# Patient Record
Sex: Male | Born: 1970 | Race: White | Hispanic: No | Smoking: Current every day smoker
Health system: Southern US, Community
[De-identification: ages and names within clinical notes are randomized; demographics above are authoritative.]

## PROBLEM LIST (undated history)

## (undated) DIAGNOSIS — J449 Chronic obstructive pulmonary disease, unspecified: Secondary | ICD-10-CM

## (undated) DIAGNOSIS — I10 Essential (primary) hypertension: Secondary | ICD-10-CM

## (undated) DIAGNOSIS — R45851 Suicidal ideations: Secondary | ICD-10-CM

## (undated) DIAGNOSIS — F1010 Alcohol abuse, uncomplicated: Secondary | ICD-10-CM

## (undated) HISTORY — PX: OTHER SURGICAL HISTORY: SHX169

---

## 2004-05-05 ENCOUNTER — Emergency Department: Payer: Self-pay | Admitting: Emergency Medicine

## 2004-05-06 ENCOUNTER — Emergency Department: Payer: Self-pay | Admitting: Emergency Medicine

## 2004-05-07 ENCOUNTER — Ambulatory Visit: Payer: Self-pay

## 2008-09-10 ENCOUNTER — Emergency Department: Payer: Self-pay | Admitting: Internal Medicine

## 2012-05-12 DIAGNOSIS — M79603 Pain in arm, unspecified: Secondary | ICD-10-CM | POA: Insufficient documentation

## 2012-05-12 DIAGNOSIS — T148XXA Other injury of unspecified body region, initial encounter: Secondary | ICD-10-CM | POA: Insufficient documentation

## 2013-06-17 ENCOUNTER — Emergency Department: Payer: Self-pay | Admitting: Emergency Medicine

## 2015-04-23 DIAGNOSIS — G894 Chronic pain syndrome: Secondary | ICD-10-CM | POA: Insufficient documentation

## 2015-09-20 DIAGNOSIS — F410 Panic disorder [episodic paroxysmal anxiety] without agoraphobia: Secondary | ICD-10-CM | POA: Insufficient documentation

## 2015-11-21 DIAGNOSIS — Z5181 Encounter for therapeutic drug level monitoring: Secondary | ICD-10-CM | POA: Insufficient documentation

## 2015-11-21 DIAGNOSIS — R5382 Chronic fatigue, unspecified: Secondary | ICD-10-CM | POA: Insufficient documentation

## 2015-11-21 DIAGNOSIS — F330 Major depressive disorder, recurrent, mild: Secondary | ICD-10-CM | POA: Insufficient documentation

## 2015-11-21 DIAGNOSIS — F1190 Opioid use, unspecified, uncomplicated: Secondary | ICD-10-CM | POA: Insufficient documentation

## 2016-01-21 DIAGNOSIS — Z79899 Other long term (current) drug therapy: Secondary | ICD-10-CM | POA: Insufficient documentation

## 2016-10-05 ENCOUNTER — Emergency Department
Admission: EM | Admit: 2016-10-05 | Discharge: 2016-10-05 | Disposition: A | Discharge status: Home / Self Care | Payer: Medicaid Other | Attending: Emergency Medicine | Admitting: Emergency Medicine

## 2016-10-05 ENCOUNTER — Emergency Department: Payer: Medicaid Other

## 2016-10-05 DIAGNOSIS — N23 Unspecified renal colic: Secondary | ICD-10-CM | POA: Insufficient documentation

## 2016-10-05 DIAGNOSIS — N50819 Testicular pain, unspecified: Secondary | ICD-10-CM

## 2016-10-05 DIAGNOSIS — R300 Dysuria: Secondary | ICD-10-CM | POA: Diagnosis not present

## 2016-10-05 DIAGNOSIS — R1031 Right lower quadrant pain: Secondary | ICD-10-CM | POA: Diagnosis present

## 2016-10-05 LAB — URINALYSIS, COMPLETE (UACMP) WITH MICROSCOPIC
Bacteria, UA: NONE SEEN
Bilirubin Urine: NEGATIVE
GLUCOSE, UA: NEGATIVE mg/dL
KETONES UR: NEGATIVE mg/dL
LEUKOCYTES UA: NEGATIVE
Nitrite: NEGATIVE
PROTEIN: NEGATIVE mg/dL
RBC / HPF: NONE SEEN RBC/hpf (ref 0–5)
Specific Gravity, Urine: 1.001 — ABNORMAL LOW (ref 1.005–1.030)
pH: 6 (ref 5.0–8.0)

## 2016-10-05 MED ORDER — TAMSULOSIN HCL 0.4 MG PO CAPS: 0.4000 mg | ORAL_CAPSULE | Freq: Every morning | ORAL | 0 refills | Status: DC

## 2016-10-05 MED ORDER — KETOROLAC TROMETHAMINE 60 MG/2ML IM SOLN
INTRAMUSCULAR | Status: AC
  Administered 2016-10-05: 60 mg via INTRAMUSCULAR
  Filled 2016-10-05: qty 2

## 2016-10-05 MED ORDER — OXYCODONE-ACETAMINOPHEN 5-325 MG PO TABS: 1.0000 | ORAL_TABLET | Freq: Four times a day (QID) | ORAL | 0 refills | Status: DC | PRN

## 2016-10-05 MED ORDER — OXYCODONE-ACETAMINOPHEN 5-325 MG PO TABS
2.0000 | ORAL_TABLET | Freq: Once | ORAL | Status: AC
  Administered 2016-10-05: 2 via ORAL
  Filled 2016-10-05: qty 2

## 2016-10-05 MED ORDER — KETOROLAC TROMETHAMINE 60 MG/2ML IM SOLN
60.0000 mg | Freq: Once | INTRAMUSCULAR | Status: AC
  Administered 2016-10-05: 60 mg via INTRAMUSCULAR

## 2016-10-05 NOTE — ED Provider Notes (Signed)
[facility] Emergency Department Provider Note       Time seen: ----------------------------------------- 9:46 PM on 10/05/2016 -----------------------------------------     I have reviewed the triage vital signs and the nursing notes.   HISTORY   Chief Complaint Groin Pain (right side) and Dysuria    HPI Jason Faulkner is a 46 y.o. male who presents to the ED for right-sided groin and testicular pain with pressure upon urination for 2 days. Patient describes pain in the suprapubic area, pain is 4 out of 10, aching and he also has pain in his right testicle. He has never had this happen before. He denies any heavy lifting or other complaints.   No past medical history on file.  There are no active problems to display for this patient.   No past surgical history on file.  Allergies Patient has no known allergies.  Social History Social History  Substance Use Topics  . Smoking status: Not on file  . Smokeless tobacco: Not on file  . Alcohol use Not on file    Review of Systems Constitutional: Negative for fever. Cardiovascular: Negative for chest pain. Respiratory: Negative for shortness of breath. Gastrointestinal: Negative for abdominal pain, vomiting and diarrhea. Genitourinary: Positive for dysuria and testicular pain Musculoskeletal: Negative for back pain. Skin: Negative for rash. Neurological: Negative for headaches, focal weakness or numbness.  All systems negative/normal/unremarkable except as stated in the HPI  ____________________________________________   PHYSICAL EXAM:  VITAL SIGNS: ED Triage Vitals  Enc Vitals Group     BP 10/05/16 2112 140/81     Pulse Rate 10/05/16 2112 81     Resp 10/05/16 2112 19     Temp 10/05/16 2112 98.4 F (36.9 C)     Temp Source 10/05/16 2112 Oral     SpO2 10/05/16 2112 99 %     Weight 10/05/16 2114 135 lb (61.2 kg)     Height 10/05/16 2114 5\' 4"  (1.626 m)     Head Circumference --       Peak Flow --      Pain Score 10/05/16 2115 4     Pain Loc --      Pain Edu? --      Excl. in GC? --     Constitutional: Alert and oriented. Well appearing and in no distress. Eyes: Conjunctivae are normal. Normal extraocular movements. Gastrointestinal: Soft and nontender. Normal bowel sounds Genitourinary: Grossly unremarkable scrotal examination. Circumcised male, nonfocal tenderness in the testicles, perhaps worse on the left. Suprapubic tenderness. Musculoskeletal: Nontender with normal range of motion in extremities. No lower extremity tenderness nor edema. Neurologic:  Normal speech and language. No gross focal neurologic deficits are appreciated.  Skin:  Skin is warm, dry and intact. No rash noted. Psychiatric: Mood and affect are normal. Speech and behavior are normal.  ___________________________________________  ED COURSE:  Pertinent labs & imaging results that were available during my care of the patient were reviewed by me and considered in my medical decision making (see chart for details). Patient presents for scrotal pain, we will assess with labs and imaging as indicated.   Procedures ____________________________________________   LABS (pertinent positives/negatives)  Labs Reviewed  URINALYSIS, COMPLETE (UACMP) WITH MICROSCOPIC - Abnormal; Notable for the following:       Result Value   Color, Urine COLORLESS (*)    APPearance CLEAR (*)    Specific Gravity, Urine 1.001 (*)    Hgb urine dipstick MODERATE (*)    Squamous Epithelial / LPF  0-5 (*)    All other components within normal limits    RADIOLOGY Images were viewed by me Scrotal ultrasound is normal Renal ultrasound IMPRESSION: 1. A 3 mm right UVJ stone with mild right hydronephrosis. 2. Cholelithiasis. Ultrasound may provide better evaluation of the gallbladder if clinically indicated. 3. No bowel obstruction or active inflammation. Normal appendix. 4. Aortic Atherosclerosis  (ICD10-I70.0). ____________________________________________  FINAL ASSESSMENT AND PLAN  Renal colic  Plan: Patient's labs and imaging were dictated above. Patient had presented for groin pain and has evidence of renal colic. He'll be discharged with pain medicine, Flomax and outpatient urology follow-up.   Emily FilbertWilliams, Adessa Primiano E, MD   Note: This note was generated in part or whole with voice recognition software. Voice recognition is usually quite accurate but there are transcription errors that can and very often do occur. I apologize for any typographical errors that were not detected and corrected.     Emily FilbertWilliams, Denecia Brunette E, MD 10/05/16 [phone]

## 2016-10-05 NOTE — ED Notes (Signed)
ED Provider at bedside. 

## 2016-10-05 NOTE — ED Triage Notes (Signed)
Pt presents to ED c/o right groin/testicle pain with pressure upon urination x2 days

## 2016-12-08 ENCOUNTER — Encounter: Payer: Self-pay | Admitting: Emergency Medicine

## 2016-12-08 ENCOUNTER — Emergency Department
Admission: EM | Admit: 2016-12-08 | Discharge: 2016-12-08 | Disposition: A | Discharge status: Home / Self Care | Payer: Medicaid Other | Attending: Emergency Medicine | Admitting: Emergency Medicine

## 2016-12-08 DIAGNOSIS — X58XXXA Exposure to other specified factors, initial encounter: Secondary | ICD-10-CM | POA: Insufficient documentation

## 2016-12-08 DIAGNOSIS — Z79899 Other long term (current) drug therapy: Secondary | ICD-10-CM | POA: Insufficient documentation

## 2016-12-08 DIAGNOSIS — S46812A Strain of other muscles, fascia and tendons at shoulder and upper arm level, left arm, initial encounter: Secondary | ICD-10-CM | POA: Insufficient documentation

## 2016-12-08 DIAGNOSIS — Y929 Unspecified place or not applicable: Secondary | ICD-10-CM | POA: Diagnosis not present

## 2016-12-08 DIAGNOSIS — Y999 Unspecified external cause status: Secondary | ICD-10-CM | POA: Insufficient documentation

## 2016-12-08 DIAGNOSIS — S4992XA Unspecified injury of left shoulder and upper arm, initial encounter: Secondary | ICD-10-CM | POA: Diagnosis present

## 2016-12-08 DIAGNOSIS — S46912A Strain of unspecified muscle, fascia and tendon at shoulder and upper arm level, left arm, initial encounter: Secondary | ICD-10-CM

## 2016-12-08 DIAGNOSIS — F17200 Nicotine dependence, unspecified, uncomplicated: Secondary | ICD-10-CM | POA: Insufficient documentation

## 2016-12-08 DIAGNOSIS — Y939 Activity, unspecified: Secondary | ICD-10-CM | POA: Diagnosis not present

## 2016-12-08 MED ORDER — LIDOCAINE 5 % EX PTCH
1.0000 | MEDICATED_PATCH | Freq: Once | CUTANEOUS | Status: DC
  Administered 2016-12-08: 1 via TRANSDERMAL
  Filled 2016-12-08: qty 1

## 2016-12-08 MED ORDER — ORPHENADRINE CITRATE 30 MG/ML IJ SOLN
60.0000 mg | Freq: Two times a day (BID) | INTRAMUSCULAR | Status: DC
  Administered 2016-12-08: 60 mg via INTRAMUSCULAR
  Filled 2016-12-08: qty 2

## 2016-12-08 MED ORDER — METHOCARBAMOL 750 MG PO TABS: 1500.0000 mg | ORAL_TABLET | Freq: Four times a day (QID) | ORAL | 0 refills | Status: DC

## 2016-12-08 MED ORDER — KETOROLAC TROMETHAMINE 60 MG/2ML IM SOLN
60.0000 mg | Freq: Once | INTRAMUSCULAR | Status: AC
  Administered 2016-12-08: 60 mg via INTRAMUSCULAR
  Filled 2016-12-08: qty 2

## 2016-12-08 NOTE — ED Triage Notes (Signed)
Pt c/o left shoulder and upper back pain.  Worse when moves.  Unable to lift left arm all the way. 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  Has been moving MIL things out since she passed away.  Reports he doesn't know if they had mold and could cause this.

## 2016-12-08 NOTE — ED Provider Notes (Signed)
[facility] Emergency Department Provider Note   ____________________________________________   First MD Initiated Contact with Patient 12/08/16 [phone]     (approximate)  I have reviewed the triage vital signs and the nursing notes.   HISTORY  Chief Complaint Shoulder Pain    HPI Jason Faulkner is a 46 y.o. male patient complaining of left upper back pain for 2-3 days. Patient stated pain increases with movement of the left upper extremity.Patient rates pain as 8/10. Patient described a pain as "achy". No palliative measures for complaint. 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 Patient stated he just continue following up with pain management secondary to the side effects of the medication prescribed for him. PCP is now located [place] [facility]. Patient has not contacted him with this complaint.   History reviewed. No pertinent past medical history.  There are no active problems to display for this patient.   Past Surgical History:  Procedure Laterality Date  . arm surgery      Prior to Admission medications   Medication Sig Start Date End Date Taking? Authorizing Provider  methocarbamol (ROBAXIN-750) 750 MG tablet Take 2 tablets (1,500 mg total) by mouth 4 (four) times daily. 12/08/16   Joni Reining, PA-C  oxyCODONE-acetaminophen (PERCOCET) 5-325 MG tablet Take 1-2 tablets by mouth every 6 (six) hours as needed. 10/05/16   Emily Filbert, MD  tamsulosin (FLOMAX) 0.4 MG CAPS capsule Take 1 capsule (0.4 mg total) by mouth every morning. 10/05/16   Emily Filbert, MD    Allergies Patient has no known allergies.  History reviewed. No pertinent family history.  Social History Social History  Substance Use Topics  . Smoking status: Current Every Day Smoker  . Smokeless tobacco: Never Used  . Alcohol use Yes    Review of Systems  Constitutional: No fever/chills Eyes: No visual changes. ENT: No  sore throat. Cardiovascular: Denies chest pain. Respiratory: Denies shortness of breath. Gastrointestinal: No abdominal pain.  No nausea, no vomiting.  No diarrhea.  No constipation. Genitourinary: Negative for dysuria. Musculoskeletal: Left upper back pain. Skin: Negative for rash. Neurological: Negative for headaches, focal weakness or numbness. Psychiatric: Anxiety and depression    ____________________________________________   PHYSICAL EXAM:  VITAL SIGNS: ED Triage Vitals  Enc Vitals Group     BP 12/08/16 0835 (!) 153/96     Pulse Rate 12/08/16 0835 92     Resp 12/08/16 0835 16     Temp 12/08/16 0835 98.2 F (36.8 C)     Temp Source 12/08/16 0835 Oral     SpO2 12/08/16 0835 99 %     Weight 12/08/16 0835 130 lb (59 kg)     Height 12/08/16 0835  (1.626 m)     Head Circumference --      Peak Flow --      Pain Score 12/08/16 0834 8     Pain Loc --      Pain Edu? --      Excl. in GC? --     Constitutional: Alert and oriented. Well appearing and in no acute distress. Neck: No stridor.  No cervical spine tenderness to palpation. Cardiovascular: Normal rate, regular rhythm. Grossly normal heart sounds.  Good peripheral circulation. Respiratory: Normal respiratory effort.  No retractions. Lungs CTAB. Musculoskeletal: Patient has  moderate guarding palpation left superior medial aspect the scapular muscle area Neurologic:  Normal speech and language. No gross focal neurologic deficits are appreciated. No  gait instability. Skin:  Skin is warm, dry and intact. No rash noted. Psychiatric: Mood and affect are normal. Speech and behavior are normal.  ____________________________________________   LABS (all labs ordered are listed, but only abnormal results are displayed)  Labs Reviewed - No data to display ____________________________________________  EKG   ____________________________________________  RADIOLOGY  No results  found.  ____________________________________________   PROCEDURES  Procedure(s) performed: None  Procedures  Critical Care performed: No  ____________________________________________   INITIAL IMPRESSION / ASSESSMENT AND PLAN / ED COURSE  Pertinent labs & imaging results that were available during my care of the patient were reviewed by me and considered in my medical decision making (see chart for details).  Left upper back pain secondary to left scapular muscle strain. Patient given discharge care instructions. Patient advised to take medication as directed and follow-up with PCP for continual care.      ____________________________________________   FINAL CLINICAL IMPRESSION(S) / ED DIAGNOSES  Final diagnoses:  Muscle strain of left scapular region, initial encounter      NEW MEDICATIONS STARTED DURING THIS VISIT:  New Prescriptions   METHOCARBAMOL (ROBAXIN-750) 750 MG TABLET    Take 2 tablets (1,500 mg total) by mouth 4 (four) times daily.     Note:  This document was prepared using Dragon voice recognition software and may include unintentional dictation errors.    Joni Reining, PA-C 12/08/16 1004    Emily Filbert, MD 12/08/16 1012

## 2016-12-08 NOTE — ED Notes (Signed)
Patient reports left shoulder pain for several days.  Patient states, "I was cleaning some boxes and stuff but I don't remember moving anything really heavy."  Patient reports he was unable to sleep last night.  Patient states, "It feels like I got a knife back there."

## 2017-09-30 DIAGNOSIS — R7989 Other specified abnormal findings of blood chemistry: Secondary | ICD-10-CM | POA: Insufficient documentation

## 2018-05-24 ENCOUNTER — Other Ambulatory Visit: Payer: Self-pay

## 2018-05-24 ENCOUNTER — Emergency Department: Payer: Medicaid Other

## 2018-05-24 ENCOUNTER — Encounter: Payer: Self-pay | Admitting: Emergency Medicine

## 2018-05-24 ENCOUNTER — Emergency Department
Admission: EM | Admit: 2018-05-24 | Discharge: 2018-05-24 | Disposition: A | Discharge status: Home / Self Care | Payer: Medicaid Other | Attending: Emergency Medicine | Admitting: Emergency Medicine

## 2018-05-24 DIAGNOSIS — J209 Acute bronchitis, unspecified: Secondary | ICD-10-CM | POA: Diagnosis not present

## 2018-05-24 DIAGNOSIS — F17210 Nicotine dependence, cigarettes, uncomplicated: Secondary | ICD-10-CM | POA: Diagnosis not present

## 2018-05-24 DIAGNOSIS — Z79899 Other long term (current) drug therapy: Secondary | ICD-10-CM | POA: Diagnosis not present

## 2018-05-24 DIAGNOSIS — R0789 Other chest pain: Secondary | ICD-10-CM | POA: Diagnosis present

## 2018-05-24 LAB — CBC
HEMATOCRIT: 53.4 % — AB (ref 39.0–52.0)
HEMOGLOBIN: 18.2 g/dL — AB (ref 13.0–17.0)
MCH: 34.6 pg — ABNORMAL HIGH (ref 26.0–34.0)
MCHC: 34.1 g/dL (ref 30.0–36.0)
MCV: 101.5 fL — ABNORMAL HIGH (ref 80.0–100.0)
Platelets: 221 10*3/uL (ref 150–400)
RBC: 5.26 MIL/uL (ref 4.22–5.81)
RDW: 14.2 % (ref 11.5–15.5)
WBC: 8.4 10*3/uL (ref 4.0–10.5)
nRBC: 0 % (ref 0.0–0.2)

## 2018-05-24 LAB — BASIC METABOLIC PANEL
Anion gap: 10 (ref 5–15)
BUN: 24 mg/dL — AB (ref 6–20)
CHLORIDE: 103 mmol/L (ref 98–111)
CO2: 23 mmol/L (ref 22–32)
Calcium: 9.1 mg/dL (ref 8.9–10.3)
Creatinine, Ser: 0.92 mg/dL (ref 0.61–1.24)
GFR calc Af Amer: >60 mL/min (ref >60)
GFR calc non Af Amer: >60 mL/min (ref >60)
Glucose, Bld: 109 mg/dL — ABNORMAL HIGH (ref 70–99)
POTASSIUM: 4 mmol/L (ref 3.5–5.1)
SODIUM: 136 mmol/L (ref 135–145)

## 2018-05-24 LAB — TROPONIN I: Troponin I: <0.03 ng/mL (ref <0.03)

## 2018-05-24 MED ORDER — IBUPROFEN 600 MG PO TABS: 600.0000 mg | ORAL_TABLET | Freq: Four times a day (QID) | ORAL | 0 refills | Status: DC | PRN

## 2018-05-24 MED ORDER — ALBUTEROL SULFATE HFA 108 (90 BASE) MCG/ACT IN AERS: 2.0000 | INHALATION_SPRAY | RESPIRATORY_TRACT | 0 refills | Status: DC | PRN

## 2018-05-24 NOTE — Discharge Instructions (Signed)
Use the albuterol inhaler every 4-6 hours for shortness of breath or chest tightness.  Take the ibuprofen up to every 6 hours as needed for pain.  Follow-up with your regular doctor.  Return to the ER for new or worsening shortness of breath, fever, or any other new or worsening symptoms that concern you.

## 2018-05-24 NOTE — ED Triage Notes (Signed)
Pt in via POV with right side chest pain, worse with coughing episodes, denies any associated symptoms.  NAD noted at this time.

## 2018-05-24 NOTE — ED Provider Notes (Signed)
[facility] Emergency Department Provider Note ____________________________________________   First MD Initiated Contact with Patient 05/24/18 1114     (approximate)  I have reviewed the triage vital signs and the nursing notes.   HISTORY  Chief Complaint Chest Pain    HPI Jason Faulkner is a 48 y.o. male with no significant PMH who presents with chest pain, gradual onset over the last 3 to 4 days, substernal, described as sharp, occurring when he coughs, and worse with certain positions and movements of the arm.  The patient reports a nonproductive cough over the last 4 days and some shortness of breath.  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  He states he wears a respirator but feels chest tightness when he is at work.  He denies any fever, vomiting or diarrhea, or any other acute symptoms.  History reviewed. No pertinent past medical history.  There are no active problems to display for this patient.   Past Surgical History:  Procedure Laterality Date  . arm surgery      Prior to Admission medications   Medication Sig Start Date End Date Taking? Authorizing Provider  albuterol (PROVENTIL HFA;VENTOLIN HFA) 108 (90 Base) MCG/ACT inhaler Inhale 2 puffs into the lungs every 4 (four) hours as needed for wheezing or shortness of breath. 05/24/18   Dionne Bucy, MD  ibuprofen (ADVIL,MOTRIN) 600 MG tablet Take 1 tablet (600 mg total) by mouth every 6 (six) hours as needed. 05/24/18   Dionne Bucy, MD  methocarbamol (ROBAXIN-750) 750 MG tablet Take 2 tablets (1,500 mg total) by mouth 4 (four) times daily. 12/08/16   Joni Reining, PA-C  oxyCODONE-acetaminophen (PERCOCET) 5-325 MG tablet Take 1-2 tablets by mouth every 6 (six) hours as needed. 10/05/16   Emily Filbert, MD  tamsulosin (FLOMAX) 0.4 MG CAPS capsule Take 1 capsule (0.4 mg total) by mouth every morning. 10/05/16   Emily Filbert, MD    Allergies Patient has no known allergies.  No family history on file.  Social History Social History   Tobacco Use  . Smoking status: Current Every Day Smoker    Packs/day: 1.00    Types: Cigarettes  . Smokeless tobacco: Never Used  Substance Use Topics  . Alcohol use: Yes  . Drug use: No    Review of Systems  Constitutional: No fever. Eyes: No redness. ENT: No sore throat. Cardiovascular: Positive for chest pain. Respiratory: Positive for shortness of breath. Gastrointestinal: No vomiting or diarrhea.  Genitourinary: Negative for dysuria.  Musculoskeletal: Negative for back pain. Skin: Negative for rash. Neurological: Positive for headache.   ____________________________________________   PHYSICAL EXAM:  VITAL SIGNS: ED Triage Vitals  Enc Vitals Group     BP 05/24/18 1015 (!) 143/84     Pulse Rate 05/24/18 1015 87     Resp 05/24/18 1015 16     Temp 05/24/18 1015 98.3 F (36.8 C)     Temp Source 05/24/18 1015 Oral     SpO2 05/24/18 1015 99 %     Weight 05/24/18 1018 125 lb (56.7 kg)     Height 05/24/18 1018 5\' 5"  (1.651 m)     Head Circumference --      Peak Flow --      Pain Score 05/24/18 1017 8     Pain Loc --      Pain Edu? --      Excl. in GC? --  Constitutional: Alert and oriented. Well appearing and in no acute distress. Eyes: Conjunctivae are normal.  Head: Atraumatic. Nose: No congestion/rhinnorhea. Mouth/Throat: Mucous membranes are moist.   Neck: Normal range of motion.  Cardiovascular: Normal rate, regular rhythm. Grossly normal heart sounds.  Good peripheral circulation. Respiratory: Normal respiratory effort.  No retractions. Lungs CTAB. Gastrointestinal: No distention.  Musculoskeletal: Extremities warm and well perfused.  Neurologic:  Normal speech and language. No gross focal neurologic deficits are appreciated.  Skin:  Skin is warm and dry. No rash noted. Psychiatric: Mood and affect are normal. Speech and  behavior are normal.  ____________________________________________   LABS (all labs ordered are listed, but only abnormal results are displayed)  Labs Reviewed  BASIC METABOLIC PANEL - Abnormal; Notable for the following components:      Result Value   Glucose, Bld 109 (*)    BUN 24 (*)    All other components within normal limits  CBC - Abnormal; Notable for the following components:   Hemoglobin 18.2 (*)    HCT 53.4 (*)    MCV 101.5 (*)    MCH 34.6 (*)    All other components within normal limits  TROPONIN I   ____________________________________________  EKG  ED ECG REPORT I, Dionne Bucy, the attending physician, personally viewed and interpreted this ECG.  Date: 05/24/2018 EKG Time: 1012 Rate: 89 Rhythm: normal sinus rhythm QRS Axis: normal Intervals: normal ST/T Wave abnormalities: normal Narrative Interpretation: no evidence of acute ischemia  ____________________________________________  RADIOLOGY  CXR: No focal infiltrate or other acute abnormality  ____________________________________________   PROCEDURES  Procedure(s) performed: No  Procedures  Critical Care performed: No ____________________________________________   INITIAL IMPRESSION / ASSESSMENT AND PLAN / ED COURSE  Pertinent labs & imaging results that were available during my care of the patient were reviewed by me and considered in my medical decision making (see chart for details).  48 year old male with no significant past medical history presents with atypical and positional sharp chest pain associated with cough and some shortness of breath over the last several days as he has been working on a job site with a lot of dust.  He denies fever or GI symptoms.  On exam the patient is well-appearing and his vital signs are normal.  He has no respiratory distress or any significant findings on lung exam.  The remainder of the exam is unremarkable.  EKG is nonischemic.  Chest x-ray  obtained from triage shows no acute abnormalities.  Labs reveal some hemoconcentration but no other acute abnormalities and negative troponin.  Overall presentation is consistent with bronchitis/reactive airways likely from dust, versus viral infection.  There is no evidence of pneumonia.  The patient has no ACS risk factors and given the low risk, reassuring EKG, and the duration of the symptoms there is no indication for repeat troponin.  The patient has no findings to suggest Covid-19 and does not meet testing criteria.  I offered nebulizer treatment and NSAID here, but the patient states he would like to go home with prescriptions.  I will prescribe albuterol and ibuprofen for symptomatic treatment.  I discussed the results of the work-up with the patient.  Return precautions given and he expressed understanding. ____________________________________________   FINAL CLINICAL IMPRESSION(S) / ED DIAGNOSES  Final diagnoses:  Acute bronchitis, unspecified organism      NEW MEDICATIONS STARTED DURING THIS VISIT:  Discharge Medication List as of 05/24/2018 11:34 AM    START taking these medications   Details  albuterol (  PROVENTIL HFA;VENTOLIN HFA) 108 (90 Base) MCG/ACT inhaler Inhale 2 puffs into the lungs every 4 (four) hours as needed for wheezing or shortness of breath., Starting Tue 05/24/2018, Print    ibuprofen (ADVIL,MOTRIN) 600 MG tablet Take 1 tablet (600 mg total) by mouth every 6 (six) hours as needed., Starting Tue 05/24/2018, Print         Note:  This document was prepared using Dragon voice recognition software and may include unintentional dictation errors.    Dionne Bucy, MD 05/24/18 1154

## 2018-05-24 NOTE — ED Notes (Signed)
Report received from [place], [place], care of pt assumed, NAD noted, pt awaiting provider evaluation at this time.  Will monitor.

## 2018-05-24 NOTE — ED Notes (Signed)
Pt taken to xray via stretcher, will obtain blood work when pt returns.

## 2018-05-24 NOTE — ED Notes (Signed)
Pt states R CP x 3 days. Has been working in [place] and didn't want to go to hospital there. + smoker. States pain worse with movement, especially moving arms above head. States pain worse with cough.

## 2018-06-15 ENCOUNTER — Encounter: Payer: Self-pay | Admitting: *Deleted

## 2018-06-15 ENCOUNTER — Other Ambulatory Visit: Payer: Self-pay

## 2018-06-15 ENCOUNTER — Emergency Department
Admission: EM | Admit: 2018-06-15 | Discharge: 2018-06-15 | Disposition: A | Discharge status: Home / Self Care | Payer: Medicaid Other | Attending: Emergency Medicine | Admitting: Emergency Medicine

## 2018-06-15 ENCOUNTER — Emergency Department: Payer: Medicaid Other

## 2018-06-15 DIAGNOSIS — F17210 Nicotine dependence, cigarettes, uncomplicated: Secondary | ICD-10-CM | POA: Diagnosis not present

## 2018-06-15 DIAGNOSIS — J209 Acute bronchitis, unspecified: Secondary | ICD-10-CM

## 2018-06-15 DIAGNOSIS — R05 Cough: Secondary | ICD-10-CM | POA: Diagnosis present

## 2018-06-15 DIAGNOSIS — Z79899 Other long term (current) drug therapy: Secondary | ICD-10-CM | POA: Insufficient documentation

## 2018-06-15 MED ORDER — HYDROCOD POLST-CPM POLST ER 10-8 MG/5ML PO SUER
5.0000 mL | Freq: Once | ORAL | Status: AC
  Administered 2018-06-15: 5 mL via ORAL
  Filled 2018-06-15: qty 5

## 2018-06-15 MED ORDER — GUAIFENESIN-CODEINE 100-10 MG/5ML PO SOLN: 5.0000 mL | Freq: Four times a day (QID) | ORAL | 0 refills | Status: DC | PRN

## 2018-06-15 MED ORDER — DOXYCYCLINE HYCLATE 100 MG PO TABS: 100.0000 mg | ORAL_TABLET | Freq: Two times a day (BID) | ORAL | 0 refills | Status: DC

## 2018-06-15 NOTE — ED Provider Notes (Signed)
[facility] Emergency Department Provider Note  Time seen: 8:38 PM  I have reviewed the triage vital signs and the nursing notes.   HISTORY  Chief Complaint Cough and Chills    HPI Jason Faulkner is a 48 y.o. male with no significant past medical history presents emergency department for continue cough with yellow/green sputum production.  According to the patient for the past 2 to 3 weeks he has had a fairly frequent cough, seen in the emergency department approximately 2 weeks ago per patient was placed on an inhaler and NSAIDs.  Continues to have cough with sputum production so the patient return to the emergency department today.  Denies any fever at any point known to the patient.  States he has been nauseated at times with occasional episodes of vomiting.  No diarrhea.  Patient states his main concern is the cough since it is not going away and wants to make sure this is not something more concerning, such as corona.  Patient denies any fever, no known sick contacts, no travel history.   No past medical history on file.  There are no active problems to display for this patient.   Past Surgical History:  Procedure Laterality Date  . arm surgery      Prior to Admission medications   Medication Sig Start Date End Date Taking? Authorizing Provider  albuterol (PROVENTIL HFA;VENTOLIN HFA) 108 (90 Base) MCG/ACT inhaler Inhale 2 puffs into the lungs every 4 (four) hours as needed for wheezing or shortness of breath. 05/24/18   Dionne Bucy, MD  ibuprofen (ADVIL,MOTRIN) 600 MG tablet Take 1 tablet (600 mg total) by mouth every 6 (six) hours as needed. 05/24/18   Dionne Bucy, MD  methocarbamol (ROBAXIN-750) 750 MG tablet Take 2 tablets (1,500 mg total) by mouth 4 (four) times daily. 12/08/16   Joni Reining, PA-C  oxyCODONE-acetaminophen (PERCOCET) 5-325 MG tablet Take 1-2 tablets by mouth every 6 (six) hours as needed. 10/05/16   Emily Filbert,  MD  tamsulosin (FLOMAX) 0.4 MG CAPS capsule Take 1 capsule (0.4 mg total) by mouth every morning. 10/05/16   Emily Filbert, MD    No Known Allergies  No family history on file.  Social History Social History   Tobacco Use  . Smoking status: Current Every Day Smoker    Packs/day: 1.00    Types: Cigarettes  . Smokeless tobacco: Never Used  Substance Use Topics  . Alcohol use: Yes  . Drug use: No    Review of Systems Constitutional: Negative for fever. Cardiovascular: Negative for chest pain. Respiratory: Negative for shortness of breath.  Positive for cough with sputum production Gastrointestinal: Negative for abdominal pain, vomiting  Musculoskeletal: Negative for musculoskeletal complaints Skin: Negative for skin complaints  Neurological: Negative for headache All other ROS negative  ____________________________________________   PHYSICAL EXAM:  VITAL SIGNS: ED Triage Vitals  Enc Vitals Group     BP 06/15/18 2017 131/85     Pulse Rate 06/15/18 2017 (!) 105     Resp 06/15/18 2017 20     Temp 06/15/18 2017 98 F (36.7 C)     Temp Source 06/15/18 2017 Oral     SpO2 06/15/18 2017 98 %     Weight 06/15/18 2018 125 lb (56.7 kg)     Height 06/15/18 2018 5\' 5"  (1.651 m)     Head Circumference --      Peak Flow --      Pain Score 06/15/18 2018 9  Pain Loc --      Pain Edu? --      Excl. in GC? --    Constitutional: Alert and oriented. Well appearing and in no distress. Eyes: Normal exam ENT   Head: Normocephalic and atraumatic.   Mouth/Throat: Mucous membranes are moist. Cardiovascular: Normal rate, regular rhythm. Respiratory: Normal respiratory effort without tachypnea nor retractions.  Very slight expiratory wheeze, frequent dry sounding cough during exam. Gastrointestinal: Soft and nontender. No distention.   Musculoskeletal: Nontender with normal range of motion in all extremities.  Neurologic:  Normal speech and language. No gross focal  neurologic deficits  Skin:  Skin is warm, dry and intact.  Psychiatric: Mood and affect are normal.   ____________________________________________    RADIOLOGY  X-ray shows mild bronchitic changes.  ____________________________________________   INITIAL IMPRESSION / ASSESSMENT AND PLAN / ED COURSE  Pertinent labs & imaging results that were available during my care of the patient were reviewed by me and considered in my medical decision making (see chart for details).  Patient presents emergency department for continued cough x2 to 3 weeks, states occasional sputum production.  States mild chest discomfort but only when coughing.  Overall patient appears extremely well, symptoms appear most consistent with bronchitis.  Appears the patient was not placed on antibiotics 2 weeks ago.  We will cover with a course of antibiotics as well as cover with cough medication.  We will check a chest x-ray as a precaution.  Patient agreeable to plan of care.  Do not suspect coronavirus.  Jason Faulkner was evaluated in Emergency Department on 06/15/2018 for the symptoms described in the history of present illness. He was evaluated in the context of the global COVID-19 pandemic, which necessitated consideration that the patient might be at risk for infection with the SARS-CoV-2 virus that causes COVID-19. Institutional protocols and algorithms that pertain to the evaluation of patients at risk for COVID-19 are in a state of rapid change based on information released by regulatory bodies including the CDC and federal and state organizations. These policies and algorithms were followed during the patient's care in the ED.   ____________________________________________   FINAL CLINICAL IMPRESSION(S) / ED DIAGNOSES  Acute bronchitis   Minna AntisPaduchowski, Braun Rocca, MD 06/15/18 2120

## 2018-06-15 NOTE — ED Triage Notes (Signed)
Pt ambulatory to triage.  Pt reports a productive cough.  States coughing up green phlegm.  Hx bronchitis.  Pt also states he has chills.  No fever.  Pt alert  Speech clear.

## 2018-08-08 IMAGING — CT CT RENAL STONE PROTOCOL
2 of 4 series · 15 of 46 positions shown, 17 images · non-contrast
Comparison: Testicular ultrasound dated 10/05/2016

CLINICAL DATA: 45-year-old male with right scrotal pain and
hematuria.

EXAM:
CT ABDOMEN AND PELVIS WITHOUT CONTRAST
TECHNIQUE: Multidetector CT imaging of the abdomen and pelvis was performed
following the standard protocol without IV contrast.

[Series 2: stone full standard · axial · 0.64mm/px · z∈[-372,+13]mm · 12 of 88 slices shown, 14 images]
[im 7/88  soft-tissue]
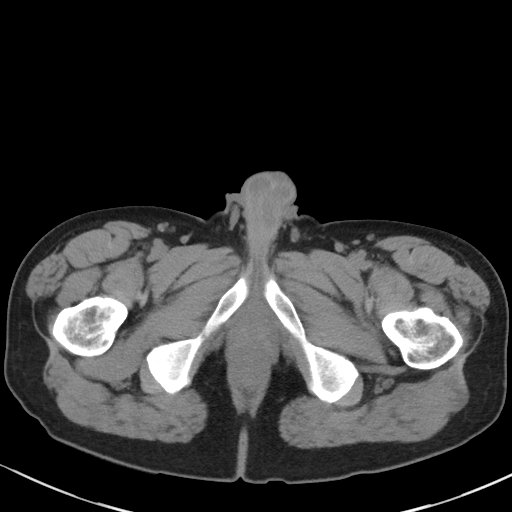
[im 7/88  bone]
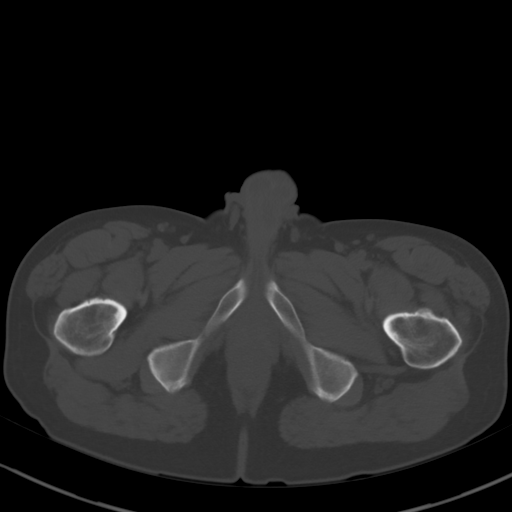
[im 14/88  soft-tissue]
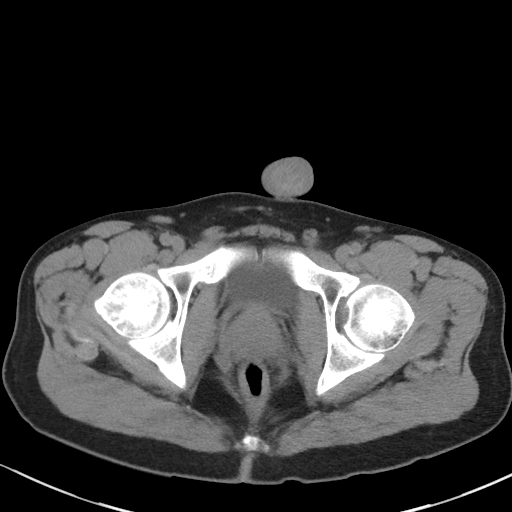
[im 21/88  soft-tissue]
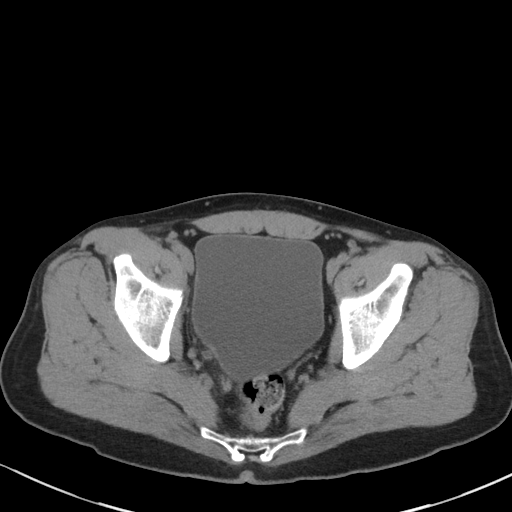
[im 28/88  soft-tissue]
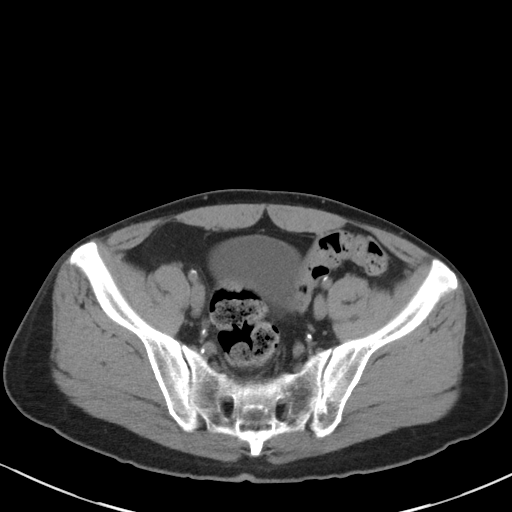
[im 35/88  soft-tissue]
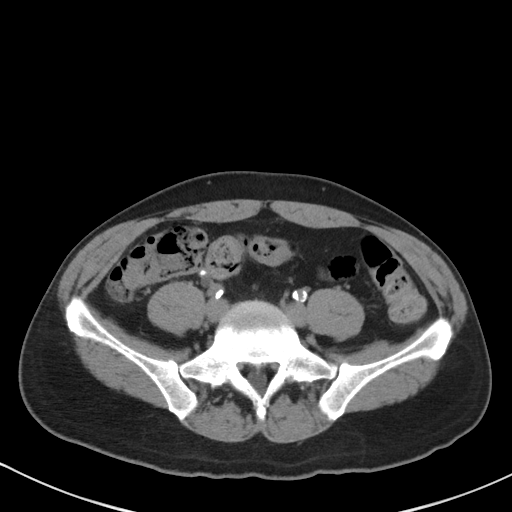
[im 42/88  soft-tissue]
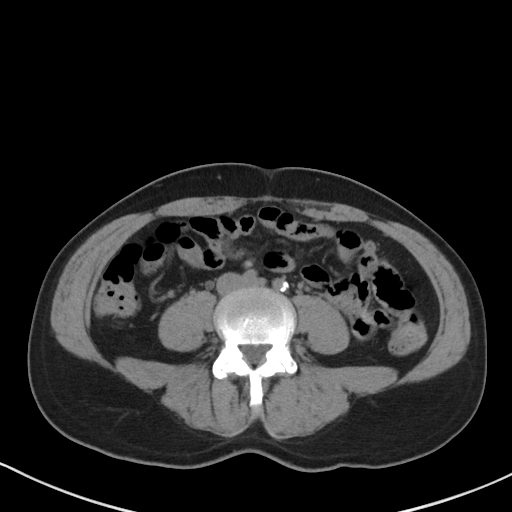
[im 49/88  soft-tissue]
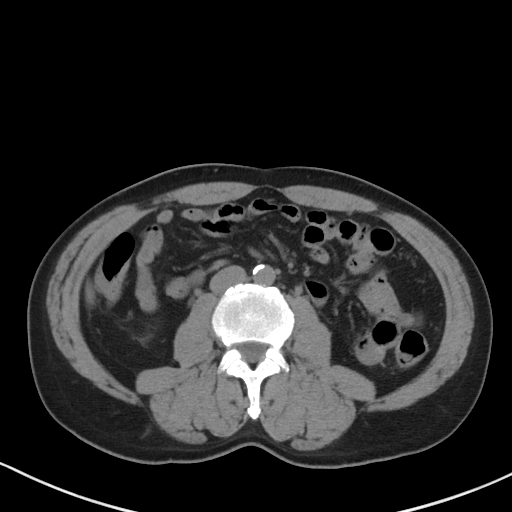
[im 56/88  soft-tissue]
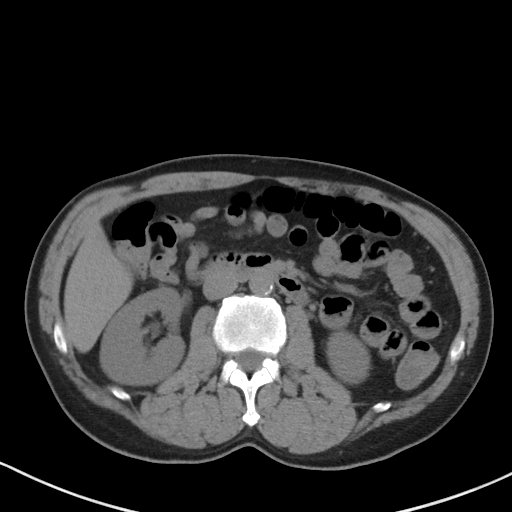
[im 63/88  soft-tissue]
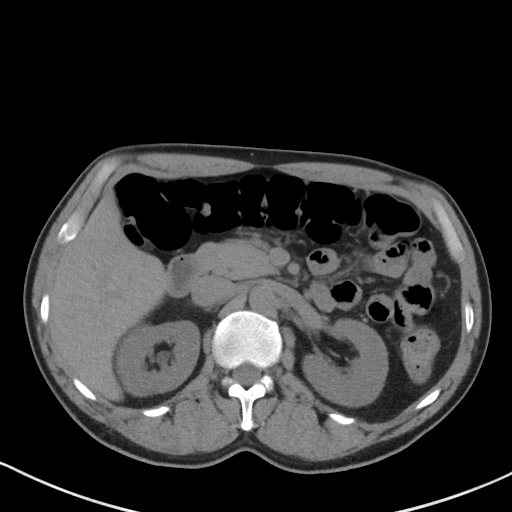
[im 63/88  bone]
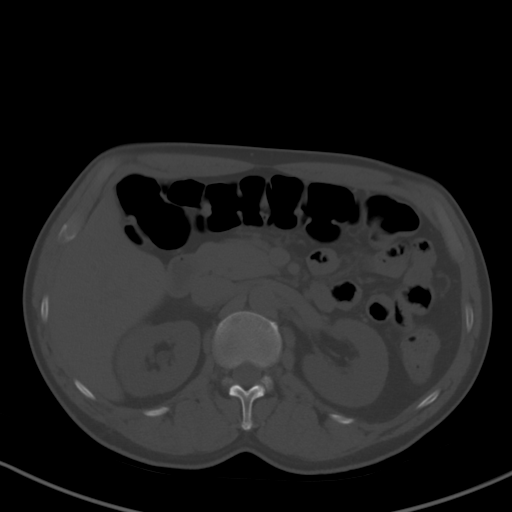
[im 70/88  soft-tissue]
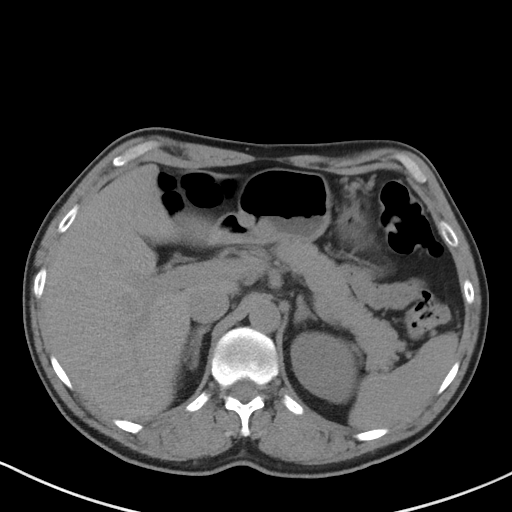
[im 77/88  soft-tissue]
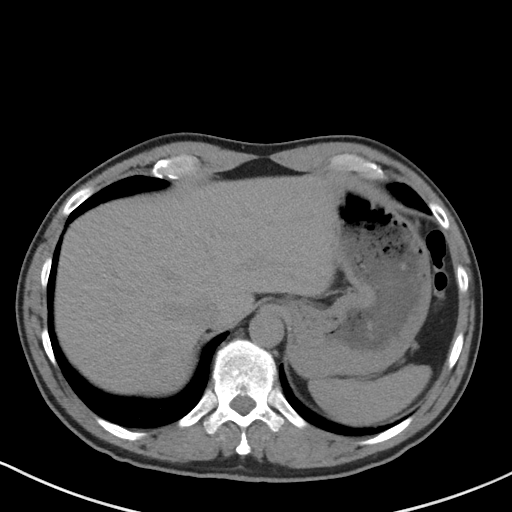
[im 84/88  soft-tissue]
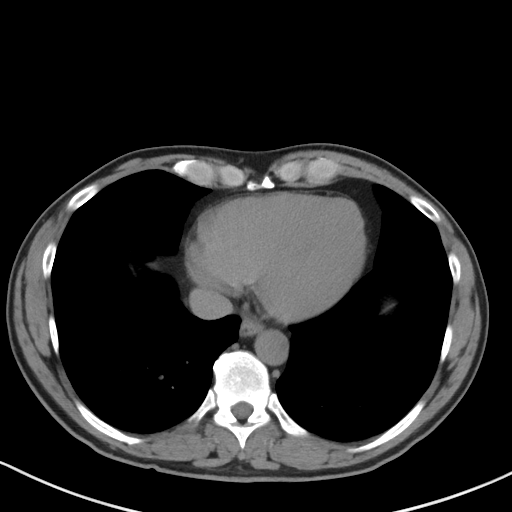

[Series 5: coronal · coronal · 0.60mm/px · 3 of 107 slices shown]
[im 36/107  soft-tissue]
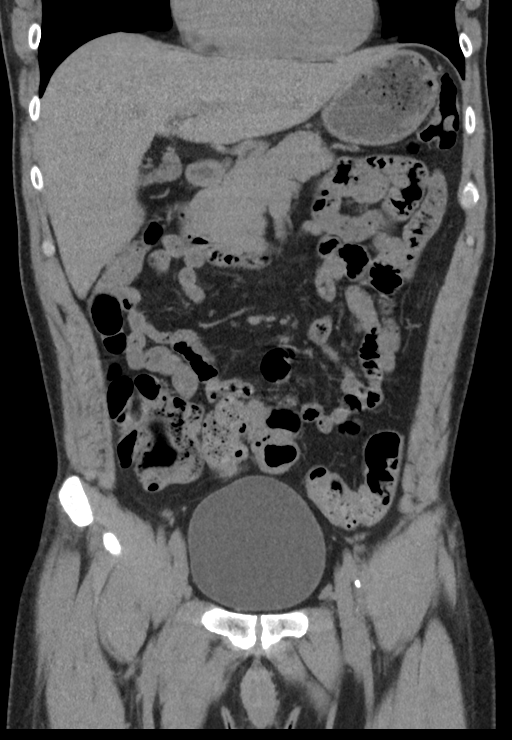
[im 48/107  soft-tissue]
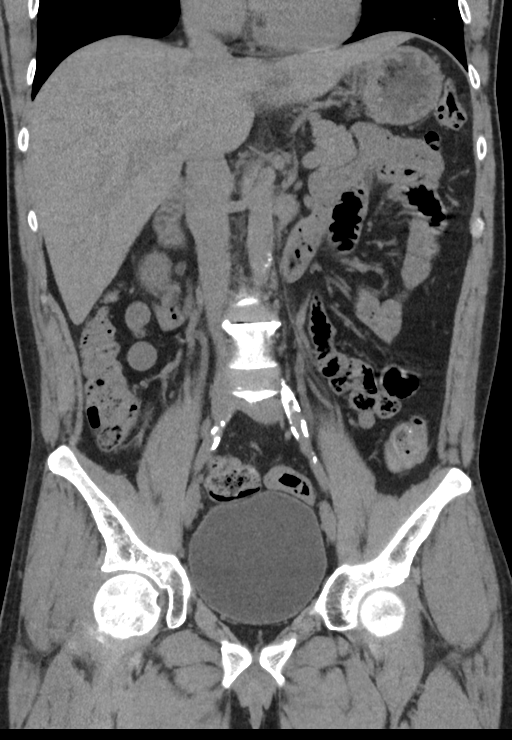
[im 59/107  soft-tissue]
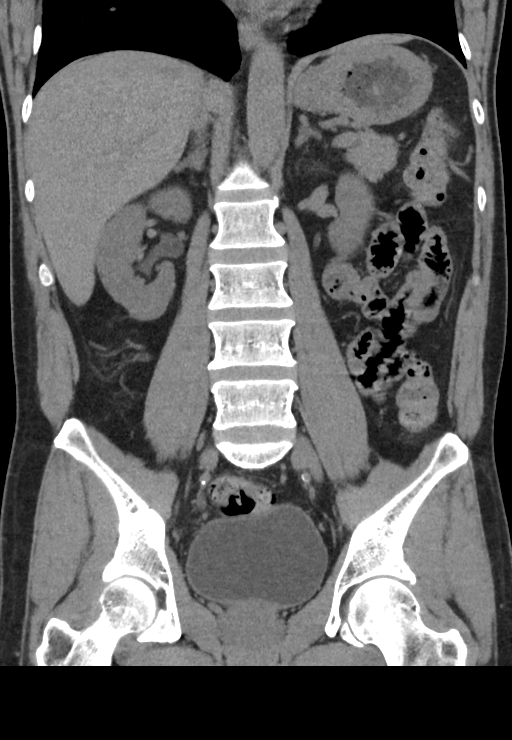

[15 of 46 positions shown; findings below may reference images not displayed]

FINDINGS: Evaluation of this exam is limited in the absence of intravenous
contrast.

Lower chest: The visualized lung bases are clear.

No intra-abdominal free air or free fluid.

Hepatobiliary: The gallbladder is contracted. Multiple small stones
noted within the gallbladder. No pericholecystic fluid. The liver is
unremarkable. No intrahepatic biliary ductal dilatation.

Pancreas: Unremarkable. No pancreatic ductal dilatation or
surrounding inflammatory changes.

Spleen: Normal in size without focal abnormality.

Adrenals/Urinary Tract: The adrenal glands are unremarkable. There
is a 3 mm stone at the right ureterovesical junction with mild right
hydronephroureter. The left kidney, left ureter, and urinary bladder
appear unremarkable.

Stomach/Bowel: There is no evidence of bowel obstruction or active
inflammation. The appendix contains small stones otherwise
unremarkable.

Vascular/Lymphatic: There is mild aortoiliac atherosclerotic
disease. No portal venous gas identified. There is no adenopathy.

Reproductive: The prostate and seminal vesicles are grossly
unremarkable.

Other: There is diastases of anterior abdominal wall musculature
with a small fat containing umbilical hernia.

Musculoskeletal: Mild degenerative changes of spine. Small right
femoral neck bone island. No acute osseous pathology.
IMPRESSION: 1. A 3 mm right UVJ stone with mild right hydronephrosis.
2. Cholelithiasis. Ultrasound may provide better evaluation of the
gallbladder if clinically indicated.
3. No bowel obstruction or active inflammation.  Normal appendix.
4.  Aortic Atherosclerosis ([id number]).

## 2018-08-26 IMAGING — US US ART/VEN ABD/PELV/SCROTUM DOPPLER LTD
1 series · 14 of 25 positions shown · non-contrast
Comparison: None.

CLINICAL DATA: Right scrotal pain and swelling for the past 2 days.

EXAM:
SCROTAL ULTRASOUND
DOPPLER ULTRASOUND OF THE TESTICLES
TECHNIQUE: Complete ultrasound examination of the testicles, epididymis, and
other scrotal structures was performed. Color and spectral Doppler
ultrasound were also utilized to evaluate blood flow to the
testicles.

[Series 1: us art/ven abd/pelv/scrotum doppler ltd · 0.07mm/px · 14 of 54 slices shown]
[im 1/54]
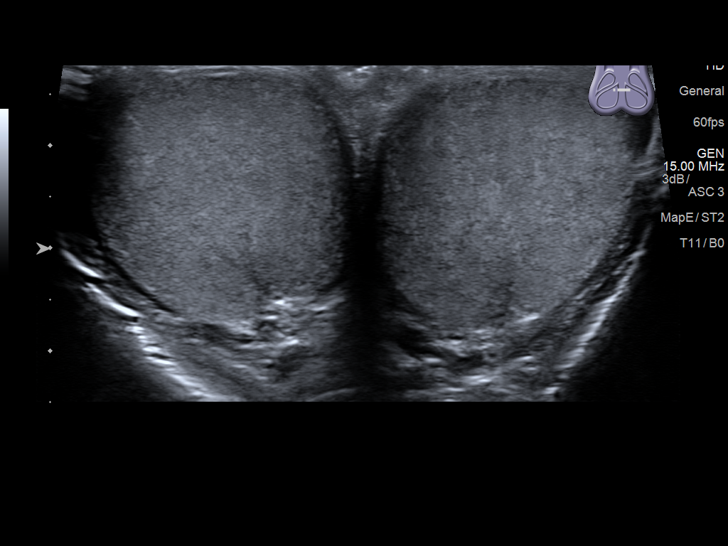
[im 5/54]
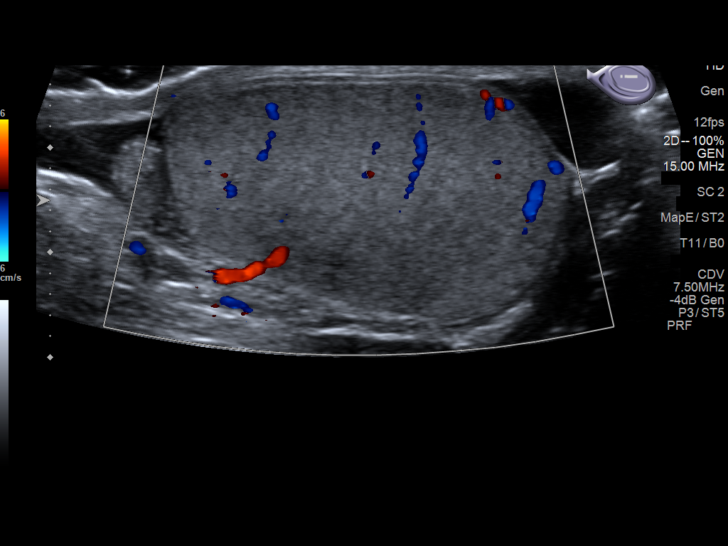
[im 9/54]
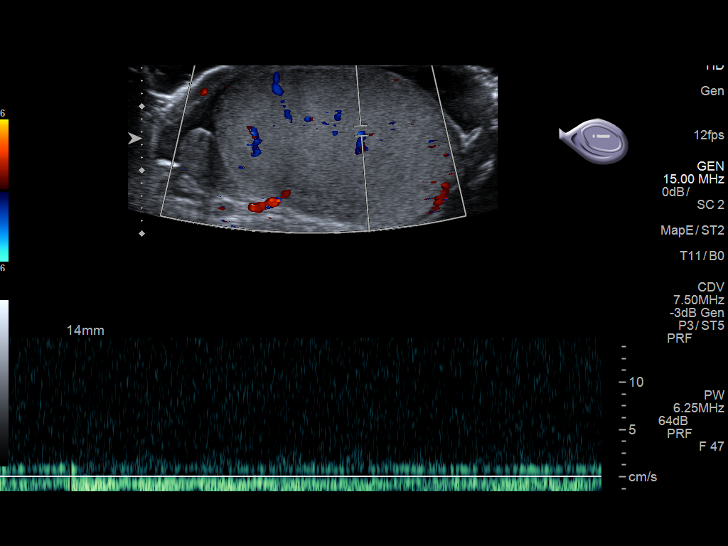
[im 14/54]
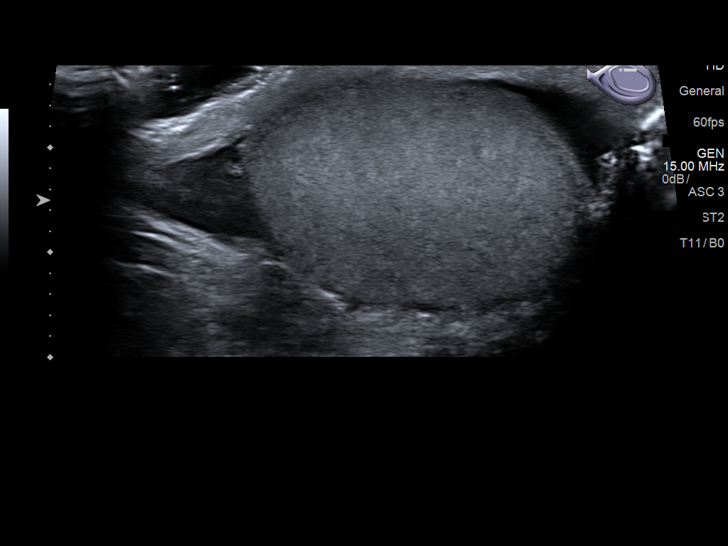
[im 18/54]
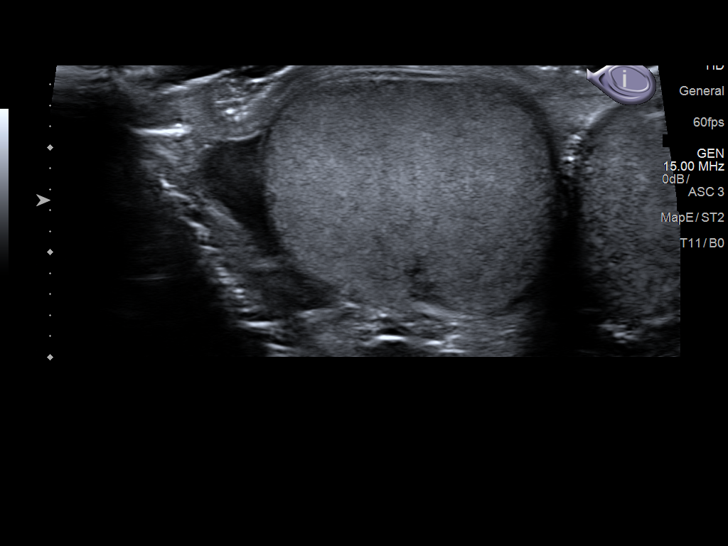
[im 20/54]
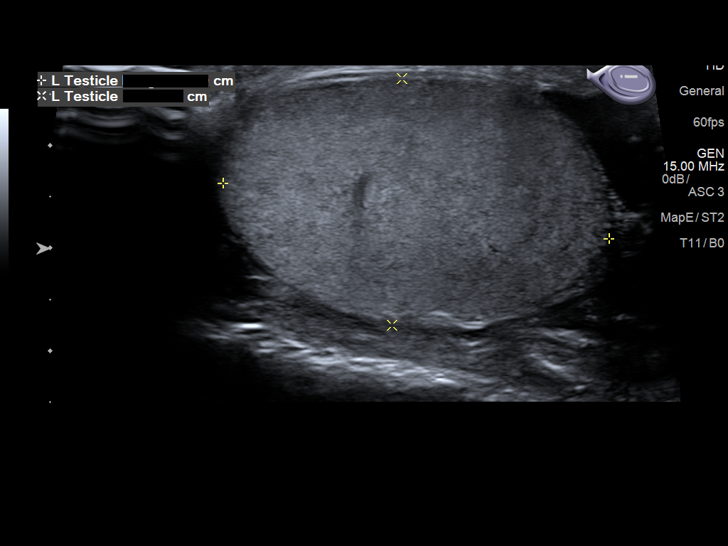
[im 25/54]
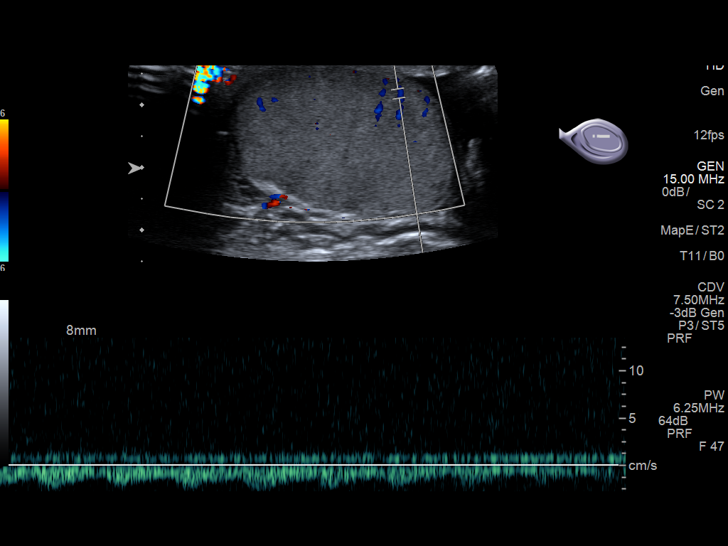
[im 29/54]
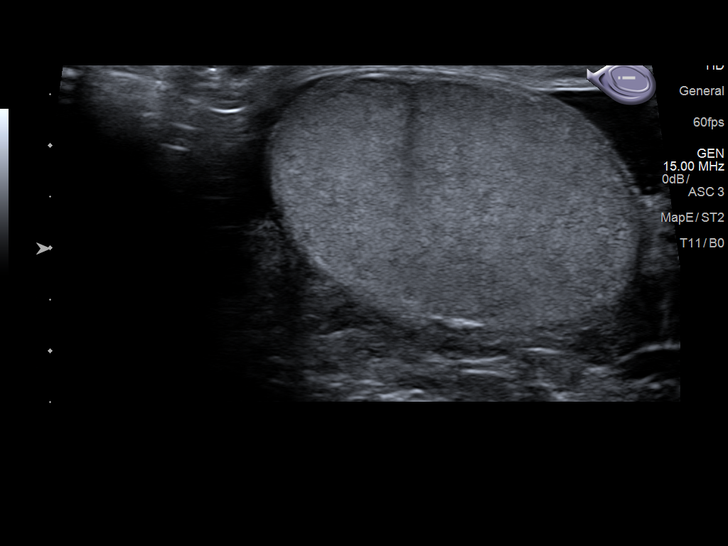
[im 34/54]
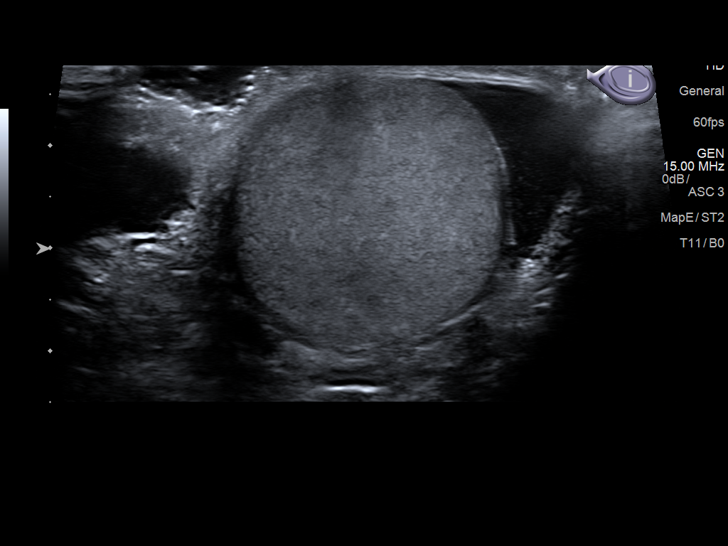
[im 36/54]
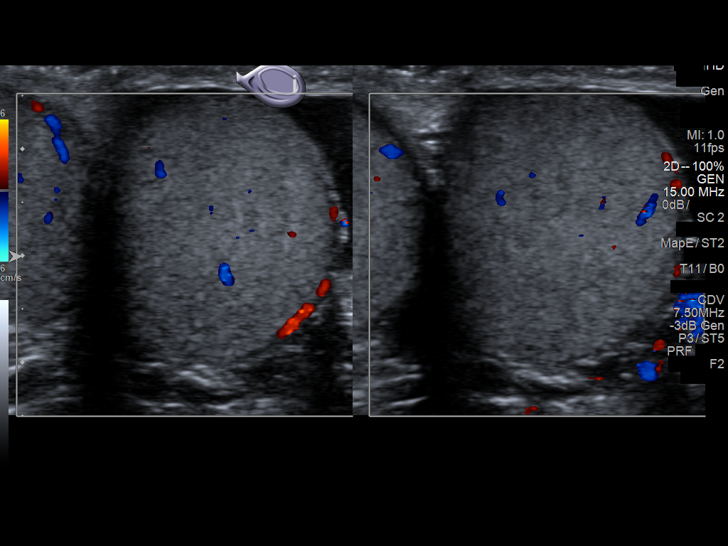
[im 40/54]
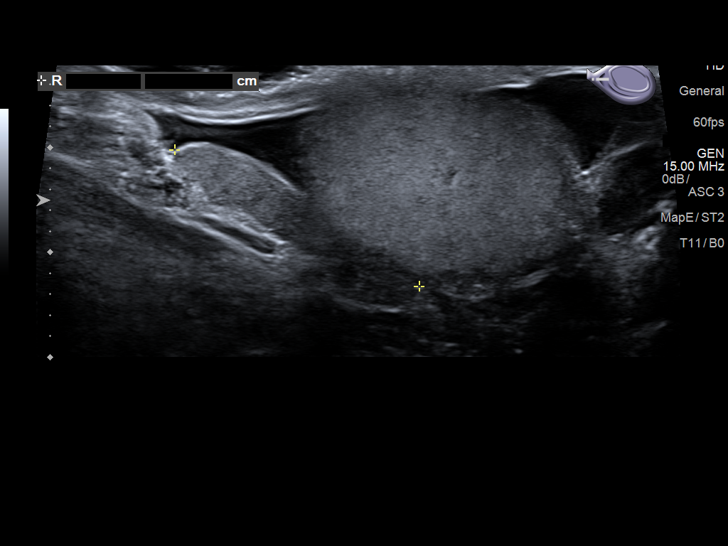
[im 45/54]
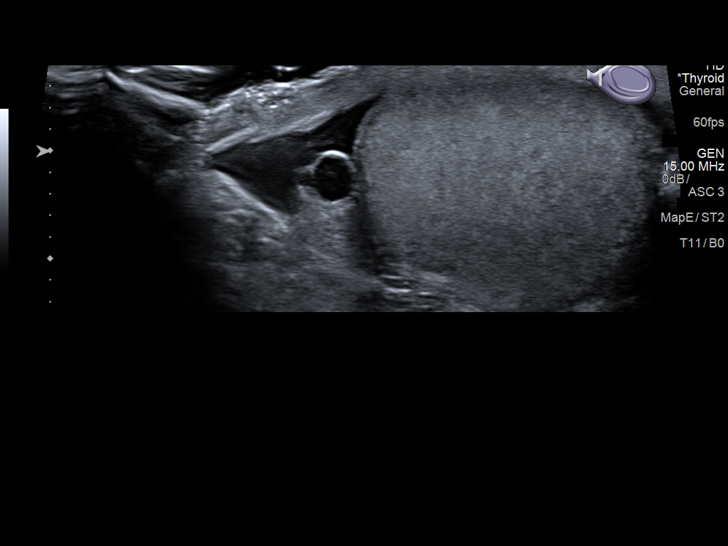
[im 49/54]
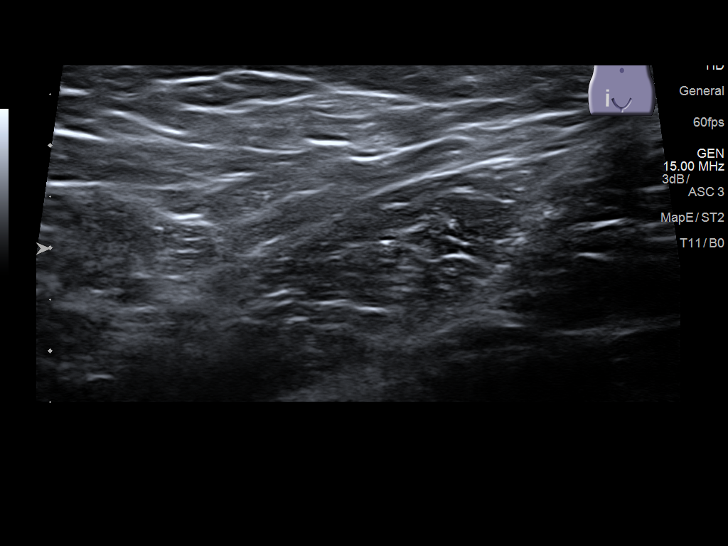
[im 54/54]
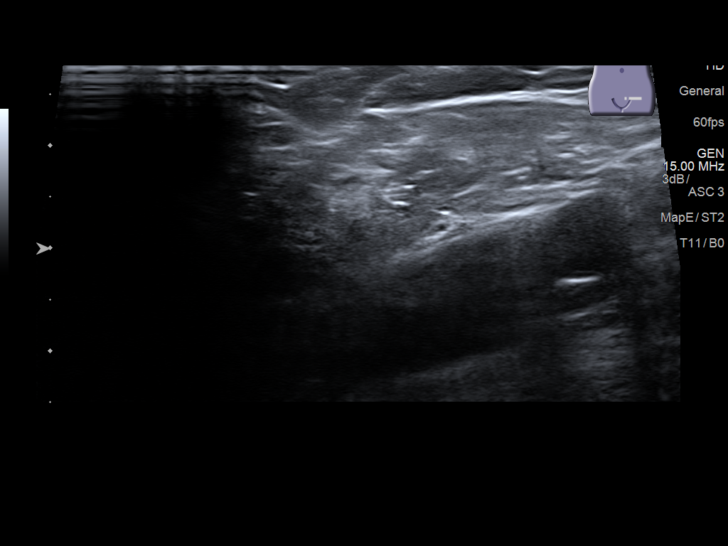

[14 of 25 positions shown; findings below may reference images not displayed]

FINDINGS: Right testicle

Measurements: 3.7 x 2.7 x 2.1 cm. No mass or microlithiasis
visualized.

Left testicle

Measurements: 3.8 x 2.5 x 2.4 cm. No mass or microlithiasis
visualized.

Right epididymis: 4 mm cyst in the head of the epididymis.
Otherwise, normal.

Left epididymis:  Normal.

Hydrocele:  Small bilateral

Varicocele:  None.

Pulsed Doppler interrogation of both testes demonstrates normal low
resistance arterial and venous waveforms bilaterally.
IMPRESSION: 1. Normal appearing testicles without torsion.
2. 4 mm right epididymal cyst. Otherwise, normal appearing
epididymi.
3. Small bilateral hydroceles.

## 2018-10-18 DIAGNOSIS — D751 Secondary polycythemia: Secondary | ICD-10-CM | POA: Insufficient documentation

## 2018-12-04 ENCOUNTER — Encounter (HOSPITAL_COMMUNITY): Payer: Self-pay

## 2018-12-04 ENCOUNTER — Other Ambulatory Visit: Payer: Self-pay

## 2018-12-04 ENCOUNTER — Emergency Department (HOSPITAL_COMMUNITY)
Admission: EM | Admit: 2018-12-04 | Discharge: 2018-12-05 | Disposition: A | Discharge status: Other Acute Inpatient Hospital | Payer: Medicaid Other | Attending: Emergency Medicine | Admitting: Emergency Medicine

## 2018-12-04 DIAGNOSIS — J449 Chronic obstructive pulmonary disease, unspecified: Secondary | ICD-10-CM | POA: Insufficient documentation

## 2018-12-04 DIAGNOSIS — F332 Major depressive disorder, recurrent severe without psychotic features: Secondary | ICD-10-CM | POA: Insufficient documentation

## 2018-12-04 DIAGNOSIS — Z20828 Contact with and (suspected) exposure to other viral communicable diseases: Secondary | ICD-10-CM | POA: Diagnosis not present

## 2018-12-04 DIAGNOSIS — R45851 Suicidal ideations: Secondary | ICD-10-CM | POA: Diagnosis not present

## 2018-12-04 DIAGNOSIS — I10 Essential (primary) hypertension: Secondary | ICD-10-CM | POA: Diagnosis not present

## 2018-12-04 DIAGNOSIS — F1010 Alcohol abuse, uncomplicated: Secondary | ICD-10-CM | POA: Diagnosis not present

## 2018-12-04 HISTORY — DX: Chronic obstructive pulmonary disease, unspecified: J44.9

## 2018-12-04 HISTORY — DX: Suicidal ideations: R45.851

## 2018-12-04 HISTORY — DX: Essential (primary) hypertension: I10

## 2018-12-04 HISTORY — DX: Alcohol abuse, uncomplicated: F10.10

## 2018-12-04 LAB — ETHANOL: Alcohol, Ethyl (B): 205 mg/dL — ABNORMAL HIGH (ref <10)

## 2018-12-04 LAB — RAPID URINE DRUG SCREEN, HOSP PERFORMED
Amphetamines: NOT DETECTED
Barbiturates: NOT DETECTED
Benzodiazepines: NOT DETECTED
Cocaine: NOT DETECTED
Opiates: NOT DETECTED
Tetrahydrocannabinol: NOT DETECTED

## 2018-12-04 LAB — CBC
HCT: 55.8 % — ABNORMAL HIGH (ref 39.0–52.0)
Hemoglobin: 19.1 g/dL — ABNORMAL HIGH (ref 13.0–17.0)
MCH: 34.9 pg — ABNORMAL HIGH (ref 26.0–34.0)
MCHC: 34.2 g/dL (ref 30.0–36.0)
MCV: 101.8 fL — ABNORMAL HIGH (ref 80.0–100.0)
Platelets: 276 10*3/uL (ref 150–400)
RBC: 5.48 MIL/uL (ref 4.22–5.81)
RDW: 12.9 % (ref 11.5–15.5)
WBC: 9.3 10*3/uL (ref 4.0–10.5)
nRBC: 0 % (ref 0.0–0.2)

## 2018-12-04 LAB — COMPREHENSIVE METABOLIC PANEL
ALT: 29 U/L (ref 0–44)
AST: 32 U/L (ref 15–41)
Albumin: 4.5 g/dL (ref 3.5–5.0)
Alkaline Phosphatase: 77 U/L (ref 38–126)
Anion gap: 12 (ref 5–15)
BUN: 8 mg/dL (ref 6–20)
CO2: 20 mmol/L — ABNORMAL LOW (ref 22–32)
Calcium: 8.7 mg/dL — ABNORMAL LOW (ref 8.9–10.3)
Chloride: 106 mmol/L (ref 98–111)
Creatinine, Ser: 0.8 mg/dL (ref 0.61–1.24)
GFR calc Af Amer: >60 mL/min (ref >60)
GFR calc non Af Amer: >60 mL/min (ref >60)
Glucose, Bld: 89 mg/dL (ref 70–99)
Potassium: 3.8 mmol/L (ref 3.5–5.1)
Sodium: 138 mmol/L (ref 135–145)
Total Bilirubin: 0.2 mg/dL — ABNORMAL LOW (ref 0.3–1.2)
Total Protein: 8.3 g/dL — ABNORMAL HIGH (ref 6.5–8.1)

## 2018-12-04 MED ORDER — THIAMINE HCL 100 MG/ML IJ SOLN: 100.0000 mg | Freq: Every day | INTRAMUSCULAR | Status: DC

## 2018-12-04 MED ORDER — LORAZEPAM 2 MG/ML IJ SOLN: 0.0000 mg | Freq: Two times a day (BID) | INTRAMUSCULAR | Status: DC

## 2018-12-04 MED ORDER — ONDANSETRON HCL 4 MG PO TABS: 4.0000 mg | ORAL_TABLET | Freq: Three times a day (TID) | ORAL | Status: DC | PRN

## 2018-12-04 MED ORDER — VITAMIN B-1 100 MG PO TABS: 100.0000 mg | ORAL_TABLET | Freq: Every day | ORAL | Status: DC

## 2018-12-04 MED ORDER — LORAZEPAM 1 MG PO TABS: 0.0000 mg | ORAL_TABLET | Freq: Two times a day (BID) | ORAL | Status: DC

## 2018-12-04 MED ORDER — NICOTINE 21 MG/24HR TD PT24
21.0000 mg | MEDICATED_PATCH | Freq: Once | TRANSDERMAL | Status: DC
  Administered 2018-12-04: 23:00:00 21 mg via TRANSDERMAL
  Filled 2018-12-04: qty 1

## 2018-12-04 MED ORDER — LORAZEPAM 1 MG PO TABS
0.0000 mg | ORAL_TABLET | Freq: Four times a day (QID) | ORAL | Status: DC
  Administered 2018-12-05: 2 mg via ORAL
  Filled 2018-12-04: qty 2

## 2018-12-04 MED ORDER — LORAZEPAM 2 MG/ML IJ SOLN: 0.0000 mg | Freq: Four times a day (QID) | INTRAMUSCULAR | Status: DC

## 2018-12-04 NOTE — ED Triage Notes (Signed)
Pt arrived via ems for suicidal ideation. Had had two failed attempts in the past. But says he is too afraid to do it tonight. Doesn't have a real plan tonight. Michela Pitcher he feels worthless.  Pt also has hx of alcohol abuse. Has had quite a bit to drink tonight.

## 2018-12-04 NOTE — ED Provider Notes (Signed)
[facility] EMERGENCY DEPARTMENT Provider Note   CSN: [id number] Arrival date & time: 12/04/18  2202     History   Chief Complaint Chief Complaint  Patient presents with  . Suicidal    HPI Jason Faulkner is a 48 y.o. male.     Patient presents to the emergency department with complaints of depression and suicidal ideation.  Patient reports long-term history of depression with suicide attempts in the past.  He also admits to alcohol abuse.  Patient presents tonight feeling suicidal without an actual plan.     Past Medical History:  Diagnosis Date  . Alcohol abuse   . COPD (chronic obstructive pulmonary disease) ([place])   . Hypertension   . Suicidal ideation     There are no active problems to display for this patient.   Past Surgical History:  Procedure Laterality Date  . arm surgery          Home Medications    Prior to Admission medications   Medication Sig Start Date End Date Taking? Authorizing Provider  albuterol (PROVENTIL HFA;VENTOLIN HFA) 108 (90 Base) MCG/ACT inhaler Inhale 2 puffs into the lungs every 4 (four) hours as needed for wheezing or shortness of breath. 05/24/18   Arta Silence, MD  guaiFENesin-codeine 100-10 MG/5ML syrup Take 5 mLs by mouth every 6 (six) hours as needed for cough. 06/15/18   Harvest Dark, MD  ibuprofen (ADVIL,MOTRIN) 600 MG tablet Take 1 tablet (600 mg total) by mouth every 6 (six) hours as needed. 05/24/18   Arta Silence, MD  methocarbamol (ROBAXIN-750) 750 MG tablet Take 2 tablets (1,500 mg total) by mouth 4 (four) times daily. 12/08/16   Sable Feil, PA-C  oxyCODONE-acetaminophen (PERCOCET) 5-325 MG tablet Take 1-2 tablets by mouth every 6 (six) hours as needed. 10/05/16   Earleen Newport, MD  tamsulosin (FLOMAX) 0.4 MG CAPS capsule Take 1 capsule (0.4 mg total) by mouth every morning. 10/05/16   Earleen Newport, MD    Family History No family history on file.  Social History Social History    Tobacco Use  . Smoking status: Current Every Day Smoker    Packs/day: 2.00    Types: Cigarettes  . Smokeless tobacco: Never Used  Substance Use Topics  . Alcohol use: Yes  . Drug use: No     Allergies   Patient has no known allergies.   Review of Systems Review of Systems  Psychiatric/Behavioral: Positive for suicidal ideas.  All other systems reviewed and are negative.    Physical Exam Updated Vital Signs BP (!) 148/111   Pulse (!) 105   Temp 97.9 F (36.6 C) (Oral)   Resp 17   Ht 5\' 3"  (1.6 m)   Wt 56.7 kg   SpO2 97%   BMI 22.14 kg/m   Physical Exam Vitals signs and nursing note reviewed.  Constitutional:      General: He is not in acute distress.    Appearance: Normal appearance. He is well-developed.  HENT:     Head: Normocephalic and atraumatic.     Right Ear: Hearing normal.     Left Ear: Hearing normal.     Nose: Nose normal.  Eyes:     Conjunctiva/sclera: Conjunctivae normal.     Pupils: Pupils are equal, round, and reactive to light.  Neck:     Musculoskeletal: Normal range of motion and neck supple.  Cardiovascular:     Rate and Rhythm: Regular rhythm.     Heart sounds: S1 normal  and S2 normal. No murmur. No friction rub. No gallop.   Pulmonary:     Effort: Pulmonary effort is normal. No respiratory distress.     Breath sounds: Normal breath sounds.  Chest:     Chest wall: No tenderness.  Abdominal:     General: Bowel sounds are normal.     Palpations: Abdomen is soft.     Tenderness: There is no abdominal tenderness. There is no guarding or rebound. Negative signs include Murphy's sign and McBurney's sign.     Hernia: No hernia is present.  Musculoskeletal: Normal range of motion.  Skin:    General: Skin is warm and dry.     Findings: No rash.  Neurological:     Mental Status: He is alert and oriented to person, place, and time.     GCS: GCS eye subscore is 4. GCS verbal subscore is 5. GCS motor subscore is 6.     Cranial Nerves: No  cranial nerve deficit.     Sensory: No sensory deficit.     Coordination: Coordination normal.  Psychiatric:        Mood and Affect: Mood is depressed.        Behavior: Behavior normal.        Thought Content: Thought content includes suicidal ideation.      ED Treatments / Results  Labs (all labs ordered are listed, but only abnormal results are displayed) Labs Reviewed  COMPREHENSIVE METABOLIC PANEL - Abnormal; Notable for the following components:      Result Value   CO2 20 (*)    Calcium 8.7 (*)    Total Protein 8.3 (*)    Total Bilirubin 0.2 (*)    All other components within normal limits  ETHANOL - Abnormal; Notable for the following components:   Alcohol, Ethyl (B) 205 (*)    All other components within normal limits  CBC - Abnormal; Notable for the following components:   Hemoglobin 19.1 (*)    HCT 55.8 (*)    MCV 101.8 (*)    MCH 34.9 (*)    All other components within normal limits  RAPID URINE DRUG SCREEN, HOSP PERFORMED    EKG None  Radiology No results found.  Procedures Procedures (including critical care time)  Medications Ordered in ED Medications  nicotine (NICODERM CQ - dosed in mg/24 hours) patch 21 mg (21 mg Transdermal Patch Applied 12/04/18 2245)     Initial Impression / Assessment and Plan / ED Course  I have reviewed the triage vital signs and the nursing notes.  Pertinent labs & imaging results that were available during my care of the patient were reviewed by me and considered in my medical decision making (see chart for details).        Patient presents to the ER for evaluation of depression and suicidal ideation.  Patient is voluntary and cooperative.  He does not have an active plan but will require psychiatric evaluation.  Final Clinical Impressions(s) / ED Diagnoses   Final diagnoses:  Suicidal ideation    ED Discharge Orders    None       Gilda Crease, MD 12/04/18 2330

## 2018-12-04 NOTE — ED Notes (Signed)
Pt belongings bagged and placed on 2nd to last shelf on the right with patient label on the outside of bag.

## 2018-12-05 ENCOUNTER — Inpatient Hospital Stay (HOSPITAL_COMMUNITY)
Admission: AD | Admit: 2018-12-05 | Discharge: 2018-12-07 | DRG: 885 | Disposition: A | Discharge status: Home / Self Care | Payer: Medicaid Other | Source: Intra-hospital | Attending: Psychiatry | Admitting: Psychiatry

## 2018-12-05 ENCOUNTER — Other Ambulatory Visit: Payer: Self-pay

## 2018-12-05 ENCOUNTER — Encounter (HOSPITAL_COMMUNITY): Payer: Self-pay | Admitting: *Deleted

## 2018-12-05 DIAGNOSIS — Z79890 Hormone replacement therapy (postmenopausal): Secondary | ICD-10-CM

## 2018-12-05 DIAGNOSIS — F17210 Nicotine dependence, cigarettes, uncomplicated: Secondary | ICD-10-CM | POA: Diagnosis present

## 2018-12-05 DIAGNOSIS — Z20828 Contact with and (suspected) exposure to other viral communicable diseases: Secondary | ICD-10-CM | POA: Diagnosis present

## 2018-12-05 DIAGNOSIS — F1994 Other psychoactive substance use, unspecified with psychoactive substance-induced mood disorder: Secondary | ICD-10-CM | POA: Diagnosis not present

## 2018-12-05 DIAGNOSIS — G4700 Insomnia, unspecified: Secondary | ICD-10-CM | POA: Diagnosis present

## 2018-12-05 DIAGNOSIS — F1024 Alcohol dependence with alcohol-induced mood disorder: Secondary | ICD-10-CM | POA: Diagnosis present

## 2018-12-05 DIAGNOSIS — R45851 Suicidal ideations: Secondary | ICD-10-CM | POA: Diagnosis present

## 2018-12-05 DIAGNOSIS — F419 Anxiety disorder, unspecified: Secondary | ICD-10-CM | POA: Diagnosis present

## 2018-12-05 DIAGNOSIS — F332 Major depressive disorder, recurrent severe without psychotic features: Secondary | ICD-10-CM | POA: Diagnosis present

## 2018-12-05 DIAGNOSIS — F10230 Alcohol dependence with withdrawal, uncomplicated: Secondary | ICD-10-CM | POA: Diagnosis not present

## 2018-12-05 DIAGNOSIS — Z79899 Other long term (current) drug therapy: Secondary | ICD-10-CM

## 2018-12-05 DIAGNOSIS — I10 Essential (primary) hypertension: Secondary | ICD-10-CM | POA: Diagnosis present

## 2018-12-05 DIAGNOSIS — J449 Chronic obstructive pulmonary disease, unspecified: Secondary | ICD-10-CM | POA: Diagnosis present

## 2018-12-05 LAB — SARS CORONAVIRUS 2 BY RT PCR (HOSPITAL ORDER, PERFORMED IN ~~LOC~~ HOSPITAL LAB): SARS Coronavirus 2: NEGATIVE

## 2018-12-05 MED ORDER — ADULT MULTIVITAMIN W/MINERALS CH
1.0000 | ORAL_TABLET | Freq: Every day | ORAL | Status: DC
  Administered 2018-12-05 – 2018-12-07 (×3): 1 via ORAL
  Filled 2018-12-05 (×4): qty 1

## 2018-12-05 MED ORDER — LORAZEPAM 1 MG PO TABS: 1.0000 mg | ORAL_TABLET | Freq: Three times a day (TID) | ORAL | Status: DC

## 2018-12-05 MED ORDER — TRAZODONE HCL 50 MG PO TABS
50.0000 mg | ORAL_TABLET | Freq: Every evening | ORAL | Status: DC | PRN
  Administered 2018-12-06: 22:00:00 50 mg via ORAL
  Filled 2018-12-05 (×2): qty 1

## 2018-12-05 MED ORDER — ALBUTEROL SULFATE HFA 108 (90 BASE) MCG/ACT IN AERS
2.0000 | INHALATION_SPRAY | RESPIRATORY_TRACT | Status: DC | PRN
  Administered 2018-12-05: 2 via RESPIRATORY_TRACT
  Filled 2018-12-05: qty 6.7

## 2018-12-05 MED ORDER — ALUM & MAG HYDROXIDE-SIMETH 200-200-20 MG/5ML PO SUSP: 30.0000 mL | ORAL | Status: DC | PRN

## 2018-12-05 MED ORDER — LORAZEPAM 1 MG PO TABS: 1.0000 mg | ORAL_TABLET | Freq: Four times a day (QID) | ORAL | Status: DC

## 2018-12-05 MED ORDER — LORAZEPAM 1 MG PO TABS: 1.0000 mg | ORAL_TABLET | Freq: Two times a day (BID) | ORAL | Status: DC

## 2018-12-05 MED ORDER — MAGNESIUM HYDROXIDE 400 MG/5ML PO SUSP: 30.0000 mL | Freq: Every day | ORAL | Status: DC | PRN

## 2018-12-05 MED ORDER — HYDROXYZINE HCL 25 MG PO TABS
25.0000 mg | ORAL_TABLET | Freq: Four times a day (QID) | ORAL | Status: DC | PRN
  Administered 2018-12-06: 25 mg via ORAL
  Filled 2018-12-05: qty 1

## 2018-12-05 MED ORDER — LORAZEPAM 1 MG PO TABS: 1.0000 mg | ORAL_TABLET | Freq: Every day | ORAL | Status: DC

## 2018-12-05 MED ORDER — LOPERAMIDE HCL 2 MG PO CAPS: 2.0000 mg | ORAL_CAPSULE | ORAL | Status: DC | PRN

## 2018-12-05 MED ORDER — LORAZEPAM 1 MG PO TABS
1.0000 mg | ORAL_TABLET | Freq: Four times a day (QID) | ORAL | Status: DC | PRN
  Administered 2018-12-05: 1 mg via ORAL
  Filled 2018-12-05: qty 1

## 2018-12-05 MED ORDER — NICOTINE 21 MG/24HR TD PT24
21.0000 mg | MEDICATED_PATCH | Freq: Every day | TRANSDERMAL | Status: DC
  Administered 2018-12-05 – 2018-12-07 (×3): 21 mg via TRANSDERMAL
  Filled 2018-12-05 (×4): qty 1

## 2018-12-05 MED ORDER — ONDANSETRON 4 MG PO TBDP: 4.0000 mg | ORAL_TABLET | Freq: Four times a day (QID) | ORAL | Status: DC | PRN

## 2018-12-05 MED ORDER — VITAMIN B-1 100 MG PO TABS
100.0000 mg | ORAL_TABLET | Freq: Every day | ORAL | Status: DC
  Administered 2018-12-06 – 2018-12-07 (×2): 100 mg via ORAL
  Filled 2018-12-05 (×3): qty 1

## 2018-12-05 MED ORDER — ACETAMINOPHEN 325 MG PO TABS
650.0000 mg | ORAL_TABLET | Freq: Four times a day (QID) | ORAL | Status: DC | PRN
  Administered 2018-12-06: 650 mg via ORAL
  Filled 2018-12-05: qty 2
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
## 2018-12-05 NOTE — Plan of Care (Signed)
  Problem: Education: Goal: Verbalization of understanding the information provided will improve Outcome: Progressing   Problem: Coping: Goal: Ability to demonstrate self-control will improve Outcome: Progressing   Problem: Safety: Goal: Periods of time without injury will increase Outcome: Progressing   

## 2018-12-05 NOTE — ED Notes (Signed)
Pt speaking with TTS at this time.  
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
## 2018-12-05 NOTE — Progress Notes (Signed)
Psychoeducational Group Note  Date:  12/05/2018 Time:  2042  Group Topic/Focus:  Wrap-Up Group:   The focus of this group is to help patients review their daily goal of treatment and discuss progress on daily workbooks.  Participation Level: Did Not Attend  Participation Quality:  Not Applicable  Affect:  Not Applicable  Cognitive:  Not Applicable  Insight:  Not Applicable  Engagement in Group: Not Applicable  Additional Comments:  The patient did not attend group.   Archie Balboa S 12/05/2018, 8:42 PM

## 2018-12-05 NOTE — Progress Notes (Signed)
CSW attempted to meet with patient to complete PSA and discuss referrals. Patient was sleeping soundly and did not awake when CSW knocked on the door or called his name. Future attempts will be made to complete assessment.    Stephanie Acre, LCSW-A Clinical Social Worker

## 2018-12-05 NOTE — Progress Notes (Signed)
Admission note:  Pt is a 48 year old Caucasian male admitted to the services of Dr. Mallie Darting for treatment of depression, suicidal ideation, and alcohol abuse.  Pt denies current suicidal ideation, states that he was drunk and when drinking "my true feelings come out".  Pt states he has experienced black outs but denies DTs.  Pt denies legal issues related to drinking.  Pt is attempting to get disability but has presently been turned down.  Pt has been unable to work and states that money is his biggest stressor.  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  Pt is a smoker and does have COPD.  Pt states beer is his drink of choice and he last had 18 beers before coming in to hospital.  Pt has a fiance of 17 years who is supportive.  Pt is cooperative with admission process.

## 2018-12-05 NOTE — Tx Team (Signed)
Initial Treatment Plan 12/05/2018 5:57 AM [name] KCM:[id number]    PATIENT STRESSORS: Financial difficulties Marital or family conflict Substance abuse   PATIENT STRENGTHS: Average or above average intelligence Motivation for treatment/growth Supportive family/friends   PATIENT IDENTIFIED PROBLEMS: Depression  Suicidal ideation  Substance abuse (ETOH)      "stop drinking"           DISCHARGE CRITERIA:  Improved stabilization in mood, thinking, and/or behavior Motivation to continue treatment in a less acute level of care Need for constant or close observation no longer present Verbal commitment to aftercare and medication compliance Withdrawal symptoms are absent or subacute and managed without 24-hour nursing intervention  PRELIMINARY DISCHARGE PLAN: Attend 12-step recovery group Outpatient therapy Return to previous living arrangement  PATIENT/FAMILY INVOLVEMENT: This treatment plan has been presented to and reviewed with the patient, Jason Faulkner.  The patient and family have been given the opportunity to ask questions and make suggestions.  Margaretann Loveless, RN 12/05/2018, 5:57 AM

## 2018-12-05 NOTE — Progress Notes (Signed)
Recreation Therapy Notes  Date:  9.28.20 Time: 0930 Location: [address] Hall Dayroom  Group Topic: Stress Management  Goal Area(s) Addresses:  Patient will identify positive stress management techniques. Patient will identify benefits of using stress management post d/c.  Intervention:  Stress Management  Activity :  Meditation.  LRT introduced the stress management technique of meditation.  LRT played Faulkner meditation that focused on making the most of your day.  Patients were to listen as the meditation played to fully engage in the activity.  Education:  Stress Management, Discharge Planning.   Education Outcome: Acknowledges Education  Clinical Observations/Feedback:  Pt did not attend group activity.    Jason Faulkner, LRT/CTRS         Ria Comment, Jason Faulkner 12/05/2018 10:45 AM

## 2018-12-05 NOTE — Progress Notes (Signed)
Patient ID: Jason Faulkner, male   DOB: [birth year], 48 y.o.   MRN: [id number]   [place] NOVEL CORONAVIRUS (COVID-19) DAILY CHECK-OFF SYMPTOMS - answer yes or no to each - every day NO YES  Have you had a fever in the past 24 hours?  . Fever (Temp > 37.80C / 100F) X   Have you had any of these symptoms in the past 24 hours? . New Cough .  Sore Throat  .  Shortness of Breath .  Difficulty Breathing .  Unexplained Body Aches   X   Have you had any one of these symptoms in the past 24 hours not related to allergies?   . Runny Nose .  Nasal Congestion .  Sneezing   X   If you have had runny nose, nasal congestion, sneezing in the past 24 hours, has it worsened?  X   EXPOSURES - check yes or no X   Have you traveled outside the state in the past 14 days?  X   Have you been in contact with someone with a confirmed diagnosis of COVID-19 or PUI in the past 14 days without wearing appropriate PPE?  X   Have you been living in the same home as a person with confirmed diagnosis of COVID-19 or a PUI (household contact)?    X   Have you been diagnosed with COVID-19?    X              What to do next: Answered NO to all: Answered YES to anything:   Proceed with unit schedule Follow the [facility] Inpatient Flowsheet.

## 2018-12-05 NOTE — Tx Team (Signed)
Interdisciplinary Treatment and Diagnostic Plan Update  12/05/2018 Time of Session: 9:00am [name] MRN: [id number]  Principal Diagnosis: <principal problem not specified>  Secondary Diagnoses: Active Problems:   * No active hospital problems. *   Current Medications:  Current Facility-Administered Medications  Medication Dose Route Frequency Provider Last Rate Last Dose  . acetaminophen (TYLENOL) tablet 650 mg  650 mg Oral Q6H PRN Lindon Romp A, NP      . albuterol (VENTOLIN HFA) 108 (90 Base) MCG/ACT inhaler 2 puff  2 puff Inhalation Q4H PRN Rozetta Nunnery, NP      . alum & mag hydroxide-simeth (MAALOX/MYLANTA) 200-200-20 MG/5ML suspension 30 mL  30 mL Oral Q4H PRN Lindon Romp A, NP      . hydrOXYzine (ATARAX/VISTARIL) tablet 25 mg  25 mg Oral Q6H PRN Lindon Romp A, NP      . loperamide (IMODIUM) capsule 2-4 mg  2-4 mg Oral PRN Lindon Romp A, NP      . LORazepam (ATIVAN) tablet 1 mg  1 mg Oral Q6H PRN Lindon Romp A, NP      . magnesium hydroxide (MILK OF MAGNESIA) suspension 30 mL  30 mL Oral Daily PRN Lindon Romp A, NP      . multivitamin with minerals tablet 1 tablet  1 tablet Oral Daily Lindon Romp A, NP   1 tablet at 12/05/18 [phone]  . nicotine (NICODERM CQ - dosed in mg/24 hours) patch 21 mg  21 mg Transdermal Daily Cobos, Myer Peer, MD   21 mg at 12/05/18 0277  . ondansetron (ZOFRAN-ODT) disintegrating tablet 4 mg  4 mg Oral Q6H PRN Rozetta Nunnery, NP      . Derrill Memo ON 12/06/2018] thiamine (VITAMIN B-1) tablet 100 mg  100 mg Oral Daily Lindon Romp A, NP      . traZODone (DESYREL) tablet 50 mg  50 mg Oral QHS PRN Rozetta Nunnery, NP       PTA Medications: Medications Prior to Admission  Medication Sig Dispense Refill Last Dose  . Testosterone 12.5 MG/ACT (1%) GEL Place 2 Pump onto the skin daily. 1 pump to each shoulder     . albuterol (PROVENTIL HFA;VENTOLIN HFA) 108 (90 Base) MCG/ACT inhaler Inhale 2 puffs into the lungs every 4 (four) hours as needed for wheezing or  shortness of breath. 1 Inhaler 0     Patient Stressors: Financial difficulties Marital or family conflict Substance abuse  Patient Strengths: Average or above average intelligence Motivation for treatment/growth Supportive family/friends  Treatment Modalities: Medication Management, Group therapy, Case management,  1 to 1 session with clinician, Psychoeducation, Recreational therapy.   Physician Treatment Plan for Primary Diagnosis: <principal problem not specified> Long Term Goal(s):     Short Term Goals:    Medication Management: Evaluate patient's response, side effects, and tolerance of medication regimen.  Therapeutic Interventions: 1 to 1 sessions, Unit Group sessions and Medication administration.  Evaluation of Outcomes: Not Met  Physician Treatment Plan for Secondary Diagnosis: Active Problems:   * No active hospital problems. *  Long Term Goal(s):     Short Term Goals:       Medication Management: Evaluate patient's response, side effects, and tolerance of medication regimen.  Therapeutic Interventions: 1 to 1 sessions, Unit Group sessions and Medication administration.  Evaluation of Outcomes: Not Met   RN Treatment Plan for Primary Diagnosis: <principal problem not specified> Long Term Goal(s): Knowledge of disease and therapeutic regimen to maintain health will improve  Short Term Goals:  Ability to identify and develop effective coping behaviors will improve  Medication Management: RN will administer medications as ordered by provider, will assess and evaluate patient's response and provide education to patient for prescribed medication. RN will report any adverse and/or side effects to prescribing provider.  Therapeutic Interventions: 1 on 1 counseling sessions, Psychoeducation, Medication administration, Evaluate responses to treatment, Monitor vital signs and CBGs as ordered, Perform/monitor CIWA, COWS, AIMS and Fall Risk screenings as ordered, Perform  wound care treatments as ordered.  Evaluation of Outcomes: Not Met   LCSW Treatment Plan for Primary Diagnosis: <principal problem not specified> Long Term Goal(s): Safe transition to appropriate next level of care at discharge, Engage patient in therapeutic group addressing interpersonal concerns.  Short Term Goals: Engage patient in aftercare planning with referrals and resources, Increase social support, Increase emotional regulation, Identify triggers associated with mental health/substance abuse issues and Increase skills for wellness and recovery  Therapeutic Interventions: Assess for all discharge needs, 1 to 1 time with Social worker, Explore available resources and support systems, Assess for adequacy in community support network, Educate family and significant other(s) on suicide prevention, Complete Psychosocial Assessment, Interpersonal group therapy.  Evaluation of Outcomes: Not Met   Progress in Treatment: Attending groups: No. New to unit.  Participating in groups: No. Taking medication as prescribed: Yes. Toleration medication: Yes. Family/Significant other contact made: No, will contact:  supports if consents are granted. Patient understands diagnosis: Yes. Discussing patient identified problems/goals with staff: Yes. Medical problems stabilized or resolved: Yes. Denies suicidal/homicidal ideation: Yes. Issues/concerns per patient self-inventory: Yes.  New problem(s) identified: Yes, Describe:  grief/loss, relationship conflict  New Short Term/Long Term Goal(s): detox, medication management for mood stabilization; elimination of SI thoughts; development of comprehensive mental wellness/sobriety plan.  Patient Goals:  "Quit drinking."  Discharge Plan or Barriers: CSW assessing for appropriate referrals.   Reason for Continuation of Hospitalization: Anxiety Depression Withdrawal symptoms  Estimated Length of Stay: 3-5 days  Attendees:  Patient: Jason Faulkner  12/05/2018 8:56 AM  Physician: Queen Blossom 12/05/2018 8:56 AM  Nursing: Thomos Lemons 12/05/2018 8:56 AM  RN Care Manager: 12/05/2018 8:56 AM  Social Worker: Stephanie Acre, Latanya Presser 12/05/2018 8:56 AM  Recreational Therapist:  12/05/2018 8:56 AM  Other: Harriett Sine, NP  12/05/2018 8:56 AM  Other:  12/05/2018 8:56 AM  Other: 12/05/2018 8:56 AM   Scribe for Treatment Team: Joellen Jersey, [place] 12/05/2018 10:20 AM

## 2018-12-06 DIAGNOSIS — F1024 Alcohol dependence with alcohol-induced mood disorder: Secondary | ICD-10-CM

## 2018-12-06 NOTE — Plan of Care (Signed)
Patient stayed in bed. Did not participate in evening activities. Flat and depressed. Was Has poor eye contact and avoiding long conversations. Patient reported that he was fine. Did not voice any concern. Was encouraged to talk to staff as needed. Safety precautions reinforced.

## 2018-12-06 NOTE — Progress Notes (Signed)
Spiritual care group on grief and loss facilitated by chaplain Zikeria Keough  Group Goal:  Support / Education around grief and loss Members engage in facilitated group support and psycho-social education.  Group Description:  Following introductions and group rules, group members engaged in facilitated group dialog and support around topic of loss, with particular support around experiences of loss in their lives. Group Identified types of loss (relationships / self / things) and identified patterns, circumstances, and changes that precipitate losses. Reflected on thoughts / feelings around loss, normalized grief responses, and recognized variety in grief experience. Patient Progress:  

## 2018-12-06 NOTE — [facility] Group Notes (Signed)
LCSW Group Therapy Note 12/06/2018 3:03 PM  Type of Therapy and Topic: Group Therapy: Overcoming Obstacles  Participation Level: Active  Description of Group:  In this group patients will be encouraged to explore what they see as obstacles to their own wellness and recovery. They will be guided to discuss their thoughts, feelings, and behaviors related to these obstacles. The group will process together ways to cope with barriers, with attention given to specific choices patients can make. Each patient will be challenged to identify changes they are motivated to make in order to overcome their obstacles. This group will be process-oriented, with patients participating in exploration of their own experiences as well as giving and receiving support and challenge from other group members.  Therapeutic Goals: 1. Patient will identify personal and current obstacles as they relate to admission. 2. Patient will identify barriers that currently interfere with their wellness or overcoming obstacles.  3. Patient will identify feelings, thought process and behaviors related to these barriers. 4. Patient will identify two changes they are willing to make to overcome these obstacles:   Summary of Patient Progress  Jason Faulkner was engaged and participated throughout the group session. Jason Faulkner reports that his only obstacle is "being in here". Jason Faulkner shared that he was never suicidal, or struggling with mental health, however he knew that he needed help to stop drinking beer. Jason Faulkner reports that he was under the impression that he would receive medications to help with detox.   Jason Faulkner reports he does not need to be in the level of care and that he is looking forward to discharge home soon.    Therapeutic Modalities:  Cognitive Behavioral Therapy Solution Focused Therapy Motivational Interviewing Relapse Prevention Therapy   Theresa Duty Clinical Social Worker

## 2018-12-06 NOTE — [facility] Group Notes (Signed)
Adult Psychoeducational Group Note  Date:  12/06/2018 Time:  9:45 AM  Group Topic/Focus:  Goals Group:   The focus of this group is to help patients establish daily goals to achieve during treatment and discuss how the patient can incorporate goal setting into their daily lives to aide in recovery.  Participation Level:  Minimal  Participation Quality:  Appropriate  Affect:  Appropriate  Cognitive:  Alert  Insight: Appropriate  Engagement in Group:  Engaged  Modes of Intervention:  Discussion  Additional Comments:  Patient attended and participate in the goals group in which he shared that his goal was to prepare for discharge and he was going to do so by talking to his doctor about developing a discharge plan.  Annie Sable 12/06/2018, 9:45 AM

## 2018-12-06 NOTE — Progress Notes (Signed)
D:  Patient's self inventory sheet, patient sleeps good, no sleep medication.  Good appetite, normal energy level, good concentration.  Denied depression and hopeless, rated anxiety #1.  Denied withdrawals.  Denied SI.  Denied physical problems.  Denied physical pain.  Goal is discharge.  Plans to talk to MD.  No discharge plans. A:  Medications administered per MD orders.  Emotional support and encouragement given patient. R:  Denied SI and HI, contracts for safety.  Denied A/V hallucinations.  Safety maintained with 15 minute checks. Patient stated "I'm good, slept good."

## 2018-12-06 NOTE — Progress Notes (Signed)
Patient shared in group that he will be getting discharged tomorrow. His goal for tomorrow is to go home. No additional details about his day were provided.

## 2018-12-06 NOTE — [facility] Counselor (Signed)
Adult Comprehensive Assessment  Patient ID: Jason Faulkner, male   DOB: March 02, 1971, 48 y.o.   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 Family Relationships: Denies any current Engineer, mining / Lack of resources (include bankruptcy): No income currently Housing / Lack of housing: Lives with his fiance and daughter in [place], [place]; Denies any current stressors Physical health (include injuries & life threatening diseases): Reports having nerve damage Social relationships: Reports his relationship with his fiance has become strained, after she accepted a "gifted car" from a male friend Substance abuse: Endorsed drinking a 6pk of beer daily; Denies any other substance use Bereavement / Loss: Denies any current stressors  Living/Environment/Situation:  Living Arrangements: Spouse/significant other, Children Living conditions (as described by patient or guardian): "Good" Who else lives in the home?: Fiance and 2yo daughter How long has patient lived in current situation?: 7 years  Family History:  Marital status: Long term relationship Long term relationship, how long?: 16 years What types of issues is patient dealing with in the relationship?: Communication; Lack of emotional support Are you sexually active?: Yes What is your sexual orientation?: Heterosexual Has your sexual activity been affected by drugs, alcohol, medication, or emotional stress?: No Does patient have children?: Yes How many children?: 2 How is patient's relationship with their children?: Reports having a good and close  relationship with his 29 year old daughter and 16 year old daughter.  Childhood History:  By whom was/is the patient raised?: Both parents Description of patient's relationship with caregiver when they were a child: "I had a good childhood"; Reports being close with his mother during his childhood. He reports he and his father had a good relationship, however his father was the disciplinarian and was very strict Patient's description of current relationship with people who raised him/her: Reports his father is currently deceased, however he and his mother continue to have a strong relationships How were you disciplined when you got in trouble as a child/adolescent?: Whoopings; Verbally Does patient have siblings?: Yes Number of Siblings: 2 Description of patient's current relationship with siblings: Reports having a good relationship with one sister, however he reports having a distant relationship with his youngest sister. Did patient suffer any verbal/emotional/physical/sexual abuse as a child?: No Did patient suffer from severe childhood neglect?: No Has patient ever been sexually abused/assaulted/raped as an adolescent or adult?: No Was the patient ever a victim of a crime or a disaster?: No Witnessed domestic violence?: No Has patient been effected by domestic violence as an adult?: No  Education:  Highest grade of school patient has completed: Chief Financial Officer Currently a Ship broker?: No Learning disability?: No  Employment/Work Situation:   Employment situation: Unemployed Patient's job has been impacted by current illness: No What is the longest time patient has a held a job?: 23 years 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) Did You Receive Any Psychiatric Treatment/Services While in Passenger transport manager?: No Are There Guns or Other Weapons in [place]?: Yes Types of Guns/Weapons: Comptroller (patient unaware of amount) Are These Weapons Safely  Secured?: Yes  Financial Resources:   Financial resources: No income, Medicaid Does patient  have a representative payee or guardian?: No  Alcohol/Substance Abuse:   What has been your use of drugs/alcohol within the last 12 months?: Endorsed drinking a 6pk of beer daily; Denies any other substance use If attempted suicide, did drugs/alcohol play a role in this?: No Alcohol/Substance Abuse Treatment Hx: Past Tx, Inpatient If yes, describe treatment: [facility] (17 years ago) Has alcohol/substance abuse ever caused legal problems?: No  Social Support System:   Forensic psychologist System: Fair Museum/gallery exhibitions officer System: "My mother and my fiance" Type of faith/religion: Catholic How does patient's faith help to cope with current illness?: Prayer  Leisure/Recreation:   Leisure and Hobbies: "Building deer heads"  Strengths/Needs:   What is the patient's perception of their strengths?: "I'm positive, making people laugh, and kind hearted" Patient states they can use these personal strengths during their treatment to contribute to their recovery: Yes Patient states these barriers may affect/interfere with their treatment: No Patient states these barriers may affect their return to the community: No Other important information patient would like considered in planning for their treatment: No  Discharge Plan:   Currently receiving community mental health services: No Patient states concerns and preferences for aftercare planning are: Expressed interest in outpatient therapy services Patient states they will know when they are safe and ready for discharge when: To be determined Does patient have access to transportation?: No Does patient have financial barriers related to discharge medications?: Yes Patient description of barriers related to discharge medications: No income; Limited resources/ supports Plan for no access to transportation at discharge: [employer] rights manager) Will  patient be returning to same living situation after discharge?: Yes  Summary/Recommendations:   Summary and Recommendations (to be completed by the evaluator): Navid is a 48 year old male who is diagnosed with Major Depressive Disorder, recurrent, severe without psychotic features and Alcohol use Disorder. He presented to the hospital seeking treatment for suicidal ideation. During the assessment, Daevon was pleasant and cooperative with providing infromatio. Lenell reports that he came to the hospital becuase he wanted to see if he could quit drinking beer. Lavell denies any other substane use, however he does endorse drinking beer excessively. Roldan states that he would like to be referred to an outpatient provider for therapy. Harrison can benefit from crisis stabilization, medication management, therapeutic milieu and referral services.  Maeola Sarah. 12/06/2018

## 2018-12-06 NOTE — Progress Notes (Signed)
CPSS met with Pt in Day Room for Peer Support group and for WRAP training. CPSS was able to discuss various tool dealing with concerns that Pt was addressing. CPSS talked with Pt about outside resources that maybe beneficial for Pt in the community also.

## 2018-12-06 NOTE — Progress Notes (Signed)
[facility] MD Progress Note  12/06/2018 9:25 AM Jason Faulkner  MRN:  [id number] Subjective: Patient denies feeling depressed at this time and states "my mood is okay".  He currently attributes suicidal statements to being intoxicated with alcohol at the time and states "I really do not have any intention to hurt myself". Currently denies neurovegetative symptoms of depression and denies feeling depressed.  He denies withdrawal symptoms and states he did not expect significant withdrawal since he had not been drinking daily.  States he had been sober for a long period time and had relapsed a few weeks ago but was not drinking daily.  He does acknowledge he drank heavily on day of admission. Objective : I reviewed chart notes and have discussed case with treatment team, have met with patient. 48 year old male who has a history of alcohol use disorder, presented to ED on 9/27 reporting suicidal ideations.  Admission BAL was 205.  Reports recent contributing stressors.  States he had purchased a used car which he then found out required expensive repairs that he cannot afford.  States his fiance travel to [place] using somebody else's car which made him feel upset.  States that due to the stressors he has been drinking more heavily.  At this time patient presents alert, attentive, calm, polite on approach.  Denies current symptoms of withdrawal and does not appear to be in any acute distress.  No tremors, no diaphoresis, no restlessness are noted.  BP 128/97, pulse 80. Currently presents future oriented, hopeful discharge soon.  Limited milieu participation thus far.  No disruptive or agitated behaviors.  He is currently not on any standing psychiatric medication.  States he does not feel he needs any at this time.  Principal Problem: Alcohol use disorder, alcohol induced mood disorder Diagnosis: Alcohol use disorder, alcohol induced mood disorder Total Time spent with patient: 15 minutes  Past Psychiatric  History:   Past Medical History:  Past Medical History:  Diagnosis Date  . Alcohol abuse   . COPD (chronic obstructive pulmonary disease) ([place])   . Hypertension   . Suicidal ideation     Past Surgical History:  Procedure Laterality Date  . arm surgery     Family History: History reviewed. No pertinent family history. Family Psychiatric  History:  Social History:  Social History   Substance and Sexual Activity  Alcohol Use Yes   Comment: varies, drinks daily     Social History   Substance and Sexual Activity  Drug Use No    Social History   Socioeconomic History  . Marital status: Significant Other    Spouse name: Not on file  . Number of children: Not on file  . Years of education: Not on file  . Highest education level: Not on file  Occupational History  . Not on file  Social Needs  . Financial resource strain: Patient refused  . Food insecurity    Worry: Patient refused    Inability: Patient refused  . Transportation needs    Medical: Patient refused    Non-medical: Patient refused  Tobacco Use  . Smoking status: Current Every Day Smoker    Packs/day: 2.00    Types: Cigarettes  . Smokeless tobacco: Never Used  . Tobacco comment: Pt declines  Substance and Sexual Activity  . Alcohol use: Yes    Comment: varies, drinks daily  . Drug use: No  . Sexual activity: Yes    Birth control/protection: None  Lifestyle  . Physical activity  Days per week: Patient refused    Minutes per session: Patient refused  . Stress: Patient refused  Relationships  . Social Herbalist on phone: Patient refused    Gets together: Patient refused    Attends religious service: Patient refused    Active member of club or organization: Patient refused    Attends meetings of clubs or organizations: Patient refused    Relationship status: Patient refused  Other Topics Concern  . Not on file  Social History Narrative  . Not on file   Additional Social History:       Sleep: Good  Appetite:  Good  Current Medications: Current Facility-Administered Medications  Medication Dose Route Frequency Provider Last Rate Last Dose  . acetaminophen (TYLENOL) tablet 650 mg  650 mg Oral Q6H PRN Lindon Romp A, NP      . albuterol (VENTOLIN HFA) 108 (90 Base) MCG/ACT inhaler 2 puff  2 puff Inhalation Q4H PRN Lindon Romp A, NP   2 puff at 12/05/18 1744  . alum & mag hydroxide-simeth (MAALOX/MYLANTA) 200-200-20 MG/5ML suspension 30 mL  30 mL Oral Q4H PRN Lindon Romp A, NP      . hydrOXYzine (ATARAX/VISTARIL) tablet 25 mg  25 mg Oral Q6H PRN Lindon Romp A, NP      . loperamide (IMODIUM) capsule 2-4 mg  2-4 mg Oral PRN Lindon Romp A, NP      . LORazepam (ATIVAN) tablet 1 mg  1 mg Oral Q6H PRN Lindon Romp A, NP   1 mg at 12/05/18 1744  . magnesium hydroxide (MILK OF MAGNESIA) suspension 30 mL  30 mL Oral Daily PRN Lindon Romp A, NP      . multivitamin with minerals tablet 1 tablet  1 tablet Oral Daily Lindon Romp A, NP   1 tablet at 12/06/18 0756  . nicotine (NICODERM CQ - dosed in mg/24 hours) patch 21 mg  21 mg Transdermal Daily Adamary Savary, Myer Peer, MD   21 mg at 12/06/18 0755  . ondansetron (ZOFRAN-ODT) disintegrating tablet 4 mg  4 mg Oral Q6H PRN Lindon Romp A, NP      . thiamine (VITAMIN B-1) tablet 100 mg  100 mg Oral Daily Lindon Romp A, NP   100 mg at 12/06/18 0756  . traZODone (DESYREL) tablet 50 mg  50 mg Oral QHS PRN Rozetta Nunnery, NP        Lab Results:  Results for orders placed or performed during the hospital encounter of 12/04/18 (from the past 48 hour(s))  Rapid urine drug screen (hospital performed)     Status: None   Collection Time: 12/04/18 10:11 PM  Result Value Ref Range   Opiates NONE DETECTED NONE DETECTED   Cocaine NONE DETECTED NONE DETECTED   Benzodiazepines NONE DETECTED NONE DETECTED   Amphetamines NONE DETECTED NONE DETECTED   Tetrahydrocannabinol NONE DETECTED NONE DETECTED   Barbiturates NONE DETECTED NONE DETECTED     Comment: (NOTE) DRUG SCREEN FOR MEDICAL PURPOSES ONLY.  IF CONFIRMATION IS NEEDED FOR ANY PURPOSE, NOTIFY LAB WITHIN 5 DAYS. LOWEST DETECTABLE LIMITS FOR URINE DRUG SCREEN Drug Class                     Cutoff (ng/mL) Amphetamine and metabolites    1000 Barbiturate and metabolites    200 Benzodiazepine                 401 Tricyclics and metabolites     300 Opiates and  metabolites        300 Cocaine and metabolites        300 THC                            50 Performed at [facility], [address] [place], [place] [address]   Comprehensive metabolic panel     Status: Abnormal   Collection Time: 12/04/18 10:48 PM  Result Value Ref Range   Sodium 138 135 - 145 mmol/L   Potassium 3.8 3.5 - 5.1 mmol/L   Chloride 106 98 - 111 mmol/L   CO2 20 (L) 22 - 32 mmol/L   Glucose, Bld 89 70 - 99 mg/dL   BUN 8 6 - 20 mg/dL   Creatinine, Ser 0.80 0.61 - 1.24 mg/dL   Calcium 8.7 (L) 8.9 - 10.3 mg/dL   Total Protein 8.3 (H) 6.5 - 8.1 g/dL   Albumin 4.5 3.5 - 5.0 g/dL   AST 32 15 - 41 U/L   ALT 29 0 - 44 U/L   Alkaline Phosphatase 77 38 - 126 U/L   Total Bilirubin 0.2 (L) 0.3 - 1.2 mg/dL   GFR calc non Af Amer >60 >60 mL/min   GFR calc Af Amer >60 >60 mL/min   Anion gap 12 5 - 15    Comment: Performed at [facility], [address] [place], [place] [address]  Ethanol     Status: Abnormal   Collection Time: 12/04/18 10:48 PM  Result Value Ref Range   Alcohol, Ethyl (B) 205 (H) <10 mg/dL    Comment: (NOTE) Lowest detectable limit for serum alcohol is 10 mg/dL. For medical purposes only. Performed at [facility], [address], [place], [place] [address]   cbc     Status: Abnormal   Collection Time: 12/04/18 10:48 PM  Result Value Ref Range   WBC 9.3 4.0 - 10.5 K/uL   RBC 5.48 4.22 - 5.81 MIL/uL   Hemoglobin 19.1 (H) 13.0 - 17.0 g/dL   HCT 55.8 (H) 39.0 - 52.0 %   MCV 101.8 (H) 80.0 - 100.0 fL   MCH 34.9 (H) 26.0 - 34.0 pg   MCHC 34.2 30.0 - 36.0 g/dL   RDW 12.9 11.5 - 15.5 %    Platelets 276 150 - 400 K/uL   nRBC 0.0 0.0 - 0.2 %    Comment: Performed at [facility], [address] [place], [place] [address]  SARS Coronavirus 2 [facility] order, Performed in [facility] hospital lab) Nasopharyngeal Nasopharyngeal Swab     Status: None   Collection Time: 12/05/18  2:48 AM   Specimen: Nasopharyngeal Swab  Result Value Ref Range   SARS Coronavirus 2 NEGATIVE NEGATIVE    Comment: (NOTE) If result is NEGATIVE SARS-CoV-2 target nucleic acids are NOT DETECTED. The SARS-CoV-2 RNA is generally detectable in upper and lower  respiratory specimens during the acute phase of infection. The lowest  concentration of SARS-CoV-2 viral copies this assay can detect is 250  copies / mL. A negative result does not preclude SARS-CoV-2 infection  and should not be used as the sole basis for treatment or other  patient management decisions.  A negative result may occur with  improper specimen collection / handling, submission of specimen other  than nasopharyngeal swab, presence of viral mutation(s) within the  areas targeted by this assay, and inadequate number of viral copies  (<250 copies / mL). A negative result must be combined  with clinical  observations, patient history, and epidemiological information. If result is POSITIVE SARS-CoV-2 target nucleic acids are DETECTED. The SARS-CoV-2 RNA is generally detectable in upper and lower  respiratory specimens dur ing the acute phase of infection.  Positive  results are indicative of active infection with SARS-CoV-2.  Clinical  correlation with patient history and other diagnostic information is  necessary to determine patient infection status.  Positive results do  not rule out bacterial infection or co-infection with other viruses. If result is PRESUMPTIVE POSTIVE SARS-CoV-2 nucleic acids MAY BE PRESENT.   A presumptive positive result was obtained on the submitted specimen  and confirmed on repeat testing.  While 2019 novel  coronavirus  (SARS-CoV-2) nucleic acids may be present in the submitted sample  additional confirmatory testing may be necessary for epidemiological  and / or clinical management purposes  to differentiate between  SARS-CoV-2 and other Sarbecovirus currently known to infect humans.  If clinically indicated additional testing with an alternate test  methodology [id number]) is advised. The SARS-CoV-2 RNA is generally  detectable in upper and lower respiratory sp ecimens during the acute  phase of infection. The expected result is Negative. Fact Sheet for Patients:  StrictlyIdeas.no Fact Sheet for Healthcare Providers: BankingDealers.co.za This test is not yet approved or cleared by the [place] FDA and has been authorized for detection and/or diagnosis of SARS-CoV-2 by FDA under an Emergency Use Authorization (EUA).  This EUA will remain in effect (meaning this test can be used) for the duration of the COVID-19 declaration under Section 564(b)(1) of the Act, 21 U.S.C. section 360bbb-3(b)(1), unless the authorization is terminated or revoked sooner. Performed at [facility], [address] [place], [place] [address]     Blood Alcohol level:  Lab Results  Component Value Date   ETH 205 (H) 65/78/4696    Metabolic Disorder Labs: No results found for: HGBA1C, MPG No results found for: PROLACTIN No results found for: CHOL, TRIG, HDL, CHOLHDL, VLDL, LDLCALC  Physical Findings: AIMS: Facial and Oral Movements Muscles of Facial Expression: None, normal Lips and Perioral Area: None, normal Jaw: None, normal Tongue: None, normal,Extremity Movements Upper (arms, wrists, hands, fingers): None, normal Lower (legs, knees, ankles, toes): None, normal, Trunk Movements Neck, shoulders, hips: None, normal, Overall Severity Severity of abnormal movements (highest score from questions above): None, normal Incapacitation due to abnormal  movements: None, normal Patient's awareness of abnormal movements (rate only patient's report): No Awareness, Dental Status Current problems with teeth and/or dentures?: No Does patient usually wear dentures?: No  CIWA:  CIWA-Ar Total: 4 COWS:     Musculoskeletal: Strength & Muscle Tone: within normal limits-no tremors, no diaphoresis, no psychomotor restlessness Gait & Station: normal Patient leans: N/A  Psychiatric Specialty Exam: Physical Exam  ROS no chest pain, no shortness of breath, no nausea, no vomiting, no fever, no chills  Blood pressure (!) 128/97, pulse 80, temperature 98.1 F (36.7 C), temperature source Oral, resp. rate 18, height 5' 3" (1.6 m), weight 57.6 kg.Body mass index is 22.5 kg/m.  General Appearance: Fairly Groomed  Eye Contact:  Good  Speech:  Normal Rate  Volume:  Normal  Mood:  Reports "my mood is fine", denies depression, presents euthymic  Affect:  Appropriate  Thought Process:  Linear and Descriptions of Associations: Intact  Orientation:  Other:  Fully alert and attentive  Thought Content:  Denies hallucinations, no delusions  Suicidal Thoughts:  No currently denies suicidal or self-injurious ideations, denies homicidal or violent ideations, contracts  for safety on unit  Homicidal Thoughts:  No  Memory:  Recent and remote grossly intact  Judgement:  Fair/improving  Insight:  Fair  Psychomotor Activity:  Normal  Concentration:  Concentration: Good and Attention Span: Good  Recall:  Good  Fund of Knowledge:  Good  Language:  Good  Akathisia:  Negative  Handed:  Right  AIMS (if indicated):     Assets:  Communication Skills Desire for Improvement Resilience  ADL's:  Intact  Cognition:  WNL  Sleep:  Number of Hours: 6.25   Assessment: 48 year old male who has a history of alcohol use disorder, presented to ED on 9/27 reporting suicidal ideations.  Admission BAL was 205.  Reports recent contributing stressors.  States he had purchased a used  car which he then found out required expensive repairs that he cannot afford.  States his fiance travel to [place] using somebody else's car which made him feel upset.  States that due to the stressors he has been drinking more heavily.  Today patient minimizes depression or neurovegetative symptoms.  Attributes suicidal ideation/statements to alcohol intoxication at the time.  Currently denies suicidal ideations and presents future oriented.  Not currently on any standing psychiatric medication.  He is not currently presenting with significant alcohol withdrawal symptoms.  He is on Ativan PRN for alcohol withdrawal as needed per CIWA protocol.   Treatment Plan Summary: Daily contact with patient to assess and evaluate symptoms and progress in treatment, Medication management, Plan Inpatient treatment and Medications as below Encourage group and milieu participation Encourage efforts to work on sobriety and relapse prevention Continue Vistaril 25 mg every 6 hours PRN for anxiety Continue Trazodone 50 mg nightly PRN for insomnia as needed Continue thiamine and multivitamin supplementation Continue Ativan PRN for alcohol withdrawal as needed Treatment team working on disposition planning options   Jenne Campus, MD 12/06/2018, 9:25 AM

## 2018-12-07 MED ORDER — ACAMPROSATE CALCIUM 333 MG PO TBEC
666.0000 mg | DELAYED_RELEASE_TABLET | Freq: Three times a day (TID) | ORAL | Status: DC
  Filled 2018-12-07 (×3): qty 2

## 2018-12-07 MED ORDER — TRAZODONE HCL 50 MG PO TABS: 50.0000 mg | ORAL_TABLET | Freq: Every evening | ORAL | 0 refills | Status: DC | PRN

## 2018-12-07 MED ORDER — HYDROXYZINE HCL 25 MG PO TABS: 25.0000 mg | ORAL_TABLET | Freq: Four times a day (QID) | ORAL | 0 refills | Status: DC | PRN

## 2018-12-07 MED ORDER — ACAMPROSATE CALCIUM 333 MG PO TBEC: 666.0000 mg | DELAYED_RELEASE_TABLET | Freq: Three times a day (TID) | ORAL | 0 refills | Status: DC

## 2018-12-07 MED ORDER — NICOTINE 21 MG/24HR TD PT24: 21.0000 mg | MEDICATED_PATCH | Freq: Every day | TRANSDERMAL | 0 refills | Status: DC

## 2018-12-07 NOTE — BHH Suicide Risk Assessment (Addendum)
[facility] Discharge Suicide Risk Assessment   Principal Problem: Alcohol dependence with alcohol-induced mood disorder [facility]) Discharge Diagnoses: Principal Problem:   Alcohol dependence with alcohol-induced mood disorder (HCC)   Total Time spent with patient: 30 minutes  Musculoskeletal: Strength & Muscle Tone: within normal limits no current tremors or psychomotor agitation, no diaphoresis Gait & Station: normal Patient leans: N/A  Psychiatric Specialty Exam: ROS no headache, no chest pain, no shortness of breath, no fever, no chills  Blood pressure (!) 136/94, pulse 71, temperature 97.8 F (36.6 C), temperature source Oral, resp. rate 18, height 5\' 3"  (1.6 m), weight 57.6 kg.Body mass index is 22.5 kg/m.  General Appearance: Well Groomed  Eye Contact::  Good  Speech:  Normal Rate409  Volume:  Normal  Mood:  reports he is feeling better  Affect:  Appropriate and reactive  Thought Process:  Linear and Descriptions of Associations: Intact  Orientation:  Other:  fully alert and attentive, oriented x 3   Thought Content:  no hallucinations , no delusions   Suicidal Thoughts:  Denies suicidal or self-injurious ideations  Homicidal Thoughts:  No denies homicidal or violent ideations  Memory:  Recent and remote grossly intact  Judgement:  Other:  Improving  Insight:  Improving  Psychomotor Activity:  Normal  Concentration:  Good  Recall:  Good  Fund of Knowledge:Good  Language: Good  Akathisia:  Negative  Handed:  Right  AIMS (if indicated):     Assets:  Communication Skills Desire for Improvement Resilience  Sleep:  Number of Hours: 6  Cognition: WNL  ADL's:  Intact   Mental Status Per Nursing Assessment::   On Admission:  Suicidal ideation indicated by others, Self-harm behaviors, Self-harm thoughts  Demographic Factors:  48 year old, lives with GF  Loss Factors: Alcohol abuse, concerned about his vehicle which requires significant repairs that he has difficulty  affording.  Recent relationship stressors with GF  Historical Factors: History of Alcohol Use Disorder  Risk Reduction Factors:   Sense of responsibility to family, Living with another person, especially a relative and Positive coping skills or problem solving skills  Continued Clinical Symptoms:  Currently patient is alert, attentive, calm, pleasant on approach.  Describes mood is improved , presents euthymic at this time, and currently denies feeling depressed.  Affect is full in range.  No thought disorder noted at this time.  Denies suicidal ideations.  Denies homicidal or violent ideations.  No psychotic symptoms. Currently not endorsing significant symptoms of withdrawal and does not appear to be uncomfortable or in any acute distress.  No diaphoresis, no distal tremors, BP 126/96 and pulse 77. Behavior on unit in good control, no disruptive or agitated behaviors. With his expressed consent I spoke with his girlfriend via phone.  She stated that their relationship has been strained due to his drinking and reported that she would have liked for patient to go to a rehab. (Patient not currently interested in referral to rehab setting). She corroborates that patient seems improved compared to admission and is in agreement with discharge today.  Of note, we discussed medication options to help with alcohol use disorder.  Agrees to Campral trial to help address cravings.  Side effects reviewed. ( *BUN 8, creatinine 0.8, GFR >60)   Cognitive Features That Contribute To Risk:  No gross cognitive deficits noted upon discharge. Is alert , attentive, and oriented x 3   Suicide Risk:  Mild:  Suicidal ideation of limited frequency, intensity, duration, and specificity.  There are no identifiable  plans, no associated intent, mild dysphoria and related symptoms, good self-control (both objective and subjective assessment), few other risk factors, and identifiable protective factors, including available  and accessible social support.  Follow-up Information    [facility] Follow up on 12/16/2018.   Why: Hospital discharge appointment for therapy services in Friday, 10/9 at 2:30p.  Please bring your photo ID and insurance card.  Contact information: [place] [address] [phone]           Plan Of Care/Follow-up recommendations:  Activity:  As tolerated Diet:  Heart healthy Tests:  NA Other:  See below  Patient is expressing readiness for discharge.  There are no grounds for involuntary commitment at this time.  He is a community in good spirits.  Plans to return home. We reviewed importance of avoiding people, places and situations associated with alcohol use in order to decrease triggers..  Follow-up as above.  Jenne Campus, MD 12/07/2018, 10:23 AM

## 2018-12-07 NOTE — BHH Suicide Risk Assessment (Signed)
[facility] INPATIENT:  Family/Significant Other Suicide Prevention Education  Suicide Prevention Education:  Education Completed; fiance, Bretta Bang [phone]) has been identified by the patient as the family member/significant other with whom the patient will be residing, and identified as the person(s) who will aid the patient in the event of a mental health crisis (suicidal ideations/suicide attempt).  With written consent from the patient, the family member/significant other has been provided the following suicide prevention education, prior to the and/or following the discharge of the patient.  The suicide prevention education provided includes the following:  Suicide risk factors  Suicide prevention and interventions  National Suicide Hotline telephone number  [facility] assessment telephone number  [facility] Emergency Assistance [place] and/or Residential Mobile Crisis Unit telephone number  Request made of family/significant other to:  Remove weapons (e.g., guns, rifles, knives), all items previously/currently identified as safety concern.    Remove drugs/medications (over-the-counter, prescriptions, illicit drugs), all items previously/currently identified as a safety concern.  The family member/significant other verbalizes understanding of the suicide prevention education information provided.  The family member/significant other agrees to remove the items of safety concern listed above.  Fiance thinks patient is rushing discharge and that he would benefit from additional treatment, specifically residential substance use treatment. She has no safety concerns.  She reports she removed all alcohol from the home.   Joellen Jersey 12/07/2018, 9:32 AM

## 2018-12-07 NOTE — Progress Notes (Signed)
Pt discharged to lobby. Pt was stable and appreciative at that time. All papers and prescriptions were given and valuables returned. Verbal understanding expressed. Denies SI/HI and A/VH. Pt given opportunity to express concerns and ask questions.  

## 2018-12-07 NOTE — Progress Notes (Signed)
  [facility] Adult Case Management Discharge Plan :  Will you be returning to the same living situation after discharge:  Yes,  home. At discharge, do you have transportation home?: Yes,  Lyft. Do you have the ability to pay for your medications: Yes,  Medicaid.  Release of information consent forms completed and in the chart. Patient to Follow up at: Follow-up Information    [facility] Follow up on 12/16/2018.   Why: Hospital discharge appointment for therapy services in Friday, 10/9 at 2:30p.  Please bring your photo ID and insurance card.  Contact information: [place] [address] [phone]           Next level of care provider has access to [facility] and Suicide Prevention discussed: Yes,  with fiance, Caryl Pina  Have you used any form of tobacco in the last 30 days? (Cigarettes, Smokeless Tobacco, Cigars, and/or Pipes): Yes  Has patient been referred to the Quitline?: Patient refused referral  Patient has been referred for addiction treatment: Yes  Joellen Jersey, [place] 12/07/2018, 9:34 AM
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
## 2018-12-07 NOTE — Plan of Care (Signed)
Pleasant and cooperative. Visible in the milieu until bedtime. Expressing readiness for discharge. No concerns throughout this shift.

## 2019-08-03 ENCOUNTER — Other Ambulatory Visit: Payer: Self-pay

## 2019-08-03 ENCOUNTER — Encounter: Payer: Self-pay | Admitting: Pain Medicine

## 2019-08-03 ENCOUNTER — Ambulatory Visit: Payer: Medicaid Other | Attending: Pain Medicine | Admitting: Pain Medicine

## 2019-08-03 VITALS — BP 161/96 | HR 87 | Temp 99.0°F | Resp 16 | Ht 65.0 in | Wt 136.0 lb

## 2019-08-03 DIAGNOSIS — F1190 Opioid use, unspecified, uncomplicated: Secondary | ICD-10-CM | POA: Diagnosis present

## 2019-08-03 DIAGNOSIS — Z789 Other specified health status: Secondary | ICD-10-CM | POA: Insufficient documentation

## 2019-08-03 DIAGNOSIS — M545 Low back pain, unspecified: Secondary | ICD-10-CM

## 2019-08-03 DIAGNOSIS — Z79899 Other long term (current) drug therapy: Secondary | ICD-10-CM | POA: Insufficient documentation

## 2019-08-03 DIAGNOSIS — G8929 Other chronic pain: Secondary | ICD-10-CM | POA: Diagnosis present

## 2019-08-03 DIAGNOSIS — M542 Cervicalgia: Secondary | ICD-10-CM | POA: Diagnosis not present

## 2019-08-03 DIAGNOSIS — M899 Disorder of bone, unspecified: Secondary | ICD-10-CM | POA: Diagnosis present

## 2019-08-03 DIAGNOSIS — Z87898 Personal history of other specified conditions: Secondary | ICD-10-CM | POA: Insufficient documentation

## 2019-08-03 DIAGNOSIS — F1291 Cannabis use, unspecified, in remission: Secondary | ICD-10-CM | POA: Insufficient documentation

## 2019-08-03 DIAGNOSIS — G894 Chronic pain syndrome: Secondary | ICD-10-CM | POA: Diagnosis not present

## 2019-08-03 DIAGNOSIS — F1024 Alcohol dependence with alcohol-induced mood disorder: Secondary | ICD-10-CM | POA: Insufficient documentation

## 2019-08-03 NOTE — Patient Instructions (Addendum)
____________________________________________________________________________________________  General Risks and Possible Complications  Patient Responsibilities: It is important that you read this as it is part of your informed consent. It is our duty to inform you of the risks and possible complications associated with treatments offered to you. It is your responsibility as a patient to read this and to ask questions about anything that is not clear or that you believe was not covered in this document.  Patient's Rights: You have the right to refuse treatment. You also have the right to change your mind, even after initially having agreed to have the treatment done. However, under this last option, if you wait until the last second to change your mind, you may be charged for the materials used up to that point.  Introduction: Medicine is not an exact science. Everything in Medicine, including the lack of treatment(s), carries the potential for danger, harm, or loss (which is by definition: Risk). In Medicine, a complication is a secondary problem, condition, or disease that can aggravate an already existing one. All treatments carry the risk of possible complications. The fact that a side effects or complications occurs, does not imply that the treatment was conducted incorrectly. It must be clearly understood that these can happen even when everything is done following the highest safety standards.  No treatment: You can choose not to proceed with the proposed treatment alternative. The "PRO(s)" would include: avoiding the risk of complications associated with the therapy. The "CON(s)" would include: not getting any of the treatment benefits. These benefits fall under one of three categories: diagnostic; therapeutic; and/or palliative. Diagnostic benefits include: getting information which can ultimately lead to improvement of the disease or symptom(s). Therapeutic benefits are those associated with the  successful treatment of the disease. Finally, palliative benefits are those related to the decrease of the primary symptoms, without necessarily curing the condition (example: decreasing the pain from a flare-up of a chronic condition, such as incurable terminal cancer).  General Risks and Complications: These are associated to most interventional treatments. They can occur alone, or in combination. They fall under one of the following six (6) categories: no benefit or worsening of symptoms; bleeding; infection; nerve damage; allergic reactions; and/or death. 1. No benefits or worsening of symptoms: In Medicine there are no guarantees, only probabilities. No healthcare provider can ever guarantee that a medical treatment will work, they can only state the probability that it may. Furthermore, there is always the possibility that the condition may worsen, either directly, or indirectly, as a consequence of the treatment. 2. Bleeding: This is more common if the patient is taking a blood thinner, either prescription or over the counter (example: Goody Powders, Fish oil, Aspirin, Garlic, etc.), or if suffering a condition associated with impaired coagulation (example: Hemophilia, cirrhosis of the liver, low platelet counts, etc.). However, even if you do not have one on these, it can still happen. If you have any of these conditions, or take one of these drugs, make sure to notify your treating physician. 3. Infection: This is more common in patients with a compromised immune system, either due to disease (example: diabetes, cancer, human immunodeficiency virus [HIV], etc.), or due to medications or treatments (example: therapies used to treat cancer and rheumatological diseases). However, even if you do not have one on these, it can still happen. If you have any of these conditions, or take one of these drugs, make sure to notify your treating physician. 4. Nerve Damage: This is more common when the   treatment is  an invasive one, but it can also happen with the use of medications, such as those used in the treatment of cancer. The damage can occur to small secondary nerves, or to large primary ones, such as those in the spinal cord and brain. This damage may be temporary or permanent and it may lead to impairments that can range from temporary numbness to permanent paralysis and/or brain death. 5. Allergic Reactions: Any time a substance or material comes in contact with our body, there is the possibility of an allergic reaction. These can range from a mild skin rash (contact dermatitis) to a severe systemic reaction (anaphylactic reaction), which can result in death. 6. Death: In general, any medical intervention can result in death, most of the time due to an unforeseen complication. ____________________________________________________________________________________________   ______________________________________________________________________________________________  Specialty Pain Scale  Introduction:  There are significant differences in how pain is reported. The word pain usually refers to physical pain, but it is also a common synonym of suffering. The medical community uses a scale from 0 (zero) to 10 (ten) to report pain level. Zero (0) is described as "no pain", while ten (10) is described as "the worse pain you can imagine". The problem with this scale is that physical pain is reported along with suffering. Suffering refers to mental pain, or more often yet it refers to any unpleasant feeling, emotion or aversion associated with the perception of harm or threat of harm. It is the psychological component of pain.  Pain Specialists prefer to separate the two components. The pain scale used by this practice is the Verbal Numerical Rating Scale (VNRS-11). This scale is for the physical pain only. DO NOT INCLUDE how your pain psychologically affects you. This scale is for adults 21 years of age and  older. It has 11 (eleven) levels. The 1st level is 0/10. This means: "right now, I have no pain". In the context of pain management, it also means: "right now, my physical pain is under control with the current therapy".  General Information:  The scale should reflect your current level of pain. Unless you are specifically asked for the level of your worst pain, or your average pain. If you are asked for one of these two, then it should be understood that it is over the past 24 hours.  Levels 1 (one) through 5 (five) are described below, and can be treated as an outpatient. Ambulatory pain management facilities such as ours are more than adequate to treat these levels. Levels 6 (six) through 10 (ten) are also described below, however, these must be treated as a hospitalized patient. While levels 6 (six) and 7 (seven) may be evaluated at an urgent care facility, levels 8 (eight) through 10 (ten) constitute medical emergencies and as such, they belong in a hospital's emergency department. When having these levels (as described below), do not come to our office. Our facility is not equipped to manage these levels. Go directly to an urgent care facility or an emergency department to be evaluated.  Definitions:  Activities of Daily Living (ADL): Activities of daily living (ADL or ADLs) is a term used in healthcare to refer to people's daily self-care activities. Health professionals often use a person's ability or inability to perform ADLs as a measurement of their functional status, particularly in regard to people post injury, with disabilities and the elderly. There are two ADL levels: Basic and Instrumental. Basic Activities of Daily Living (BADL  or BADLs) consist of self-care   tasks that include: Bathing and showering; personal hygiene and grooming (including brushing/combing/styling hair); dressing; Toilet hygiene (getting to the toilet, cleaning oneself, and getting back up); eating and self-feeding (not  including cooking or chewing and swallowing); functional mobility, often referred to as "transferring", as measured by the ability to walk, get in and out of bed, and get into and out of a chair; the broader definition (moving from one place to another while performing activities) is useful for people with different physical abilities who are still able to get around independently. Basic ADLs include the things many people do when they get up in the morning and get ready to go out of the house: get out of bed, go to the toilet, bathe, dress, groom, and eat. On the average, loss of function typically follows a particular order. Hygiene is the first to go, followed by loss of toilet use and locomotion. The last to go is the ability to eat. When there is only one remaining area in which the person is independent, there is a 62.9% chance that it is eating and only a 3.5% chance that it is hygiene. Instrumental Activities of Daily Living (IADL or IADLs) are not necessary for fundamental functioning, but they let an individual live independently in a community. IADL consist of tasks that include: cleaning and maintaining the house; home establishment and maintenance; care of others (including selecting and supervising caregivers); care of pets; child rearing; managing money; managing financials (investments, etc.); meal preparation and cleanup; shopping for groceries and necessities; moving within the community; safety procedures and emergency responses; health management and maintenance (taking prescribed medications); and using the telephone or other form of communication.  Instructions:  Most patients tend to report their pain as a combination of two factors, their physical pain and their psychosocial pain. This last one is also known as "suffering" and it is reflection of how physical pain affects you socially and psychologically. From now on, report them separately.  From this point on, when asked to report  your pain level, report only your physical pain. Use the following table for reference.  Pain Clinic Pain Levels (0-5/10)  Pain Level Score  Description  No Pain 0   Mild pain 1 Nagging, annoying, but does not interfere with basic activities of daily living (ADL). Patients are able to eat, bathe, get dressed, toileting (being able to get on and off the toilet and perform personal hygiene functions), transfer (move in and out of bed or a chair without assistance), and maintain continence (able to control bladder and bowel functions). Blood pressure and heart rate are unaffected. A normal heart rate for a healthy adult ranges from 60 to 100 bpm (beats per minute).   Mild to moderate pain 2 Noticeable and distracting. Impossible to hide from other people. More frequent flare-ups. Still possible to adapt and function close to normal. It can be very annoying and may have occasional stronger flare-ups. With discipline, patients may get used to it and adapt.   Moderate pain 3 Interferes significantly with activities of daily living (ADL). It becomes difficult to feed, bathe, get dressed, get on and off the toilet or to perform personal hygiene functions. Difficult to get in and out of bed or a chair without assistance. Very distracting. With effort, it can be ignored when deeply involved in activities.   Moderately severe pain 4 Impossible to ignore for more than a few minutes. With effort, patients may still be able to manage work or participate in   some social activities. Very difficult to concentrate. Signs of autonomic nervous system discharge are evident: dilated pupils (mydriasis); mild sweating (diaphoresis); sleep interference. Heart rate becomes elevated (>115 bpm). Diastolic blood pressure (lower number) rises above 100 mmHg. Patients find relief in laying down and not moving.   Severe pain 5 Intense and extremely unpleasant. Associated with frowning face and frequent crying. Pain overwhelms the  senses.  Ability to do any activity or maintain social relationships becomes significantly limited. Conversation becomes difficult. Pacing back and forth is common, as getting into a comfortable position is nearly impossible. Pain wakes you up from deep sleep. Physical signs will be obvious: pupillary dilation; increased sweating; goosebumps; brisk reflexes; cold, clammy hands and feet; nausea, vomiting or dry heaves; loss of appetite; significant sleep disturbance with inability to fall asleep or to remain asleep. When persistent, significant weight loss is observed due to the complete loss of appetite and sleep deprivation.  Blood pressure and heart rate becomes significantly elevated. Caution: If elevated blood pressure triggers a pounding headache associated with blurred vision, then the patient should immediately seek attention at an urgent or emergency care unit, as these may be signs of an impending stroke.    Emergency Department Pain Levels (6-10/10)  Emergency Room Pain 6 Severely limiting. Requires emergency care and should not be seen or managed at an outpatient pain management facility. Communication becomes difficult and requires great effort. Assistance to reach the emergency department may be required. Facial flushing and profuse sweating along with potentially dangerous increases in heart rate and blood pressure will be evident.   Distressing pain 7 Self-care is very difficult. Assistance is required to transport, or use restroom. Assistance to reach the emergency department will be required. Tasks requiring coordination, such as bathing and getting dressed become very difficult.   Disabling pain 8 Self-care is no longer possible. At this level, pain is disabling. The individual is unable to do even the most "basic" activities such as walking, eating, bathing, dressing, transferring to a bed, or toileting. Fine motor skills are lost. It is difficult to think clearly.   Incapacitating pain  9 Pain becomes incapacitating. Thought processing is no longer possible. Difficult to remember your own name. Control of movement and coordination are lost.   The worst pain imaginable 10 At this level, most patients pass out from pain. When this level is reached, collapse of the autonomic nervous system occurs, leading to a sudden drop in blood pressure and heart rate. This in turn results in a temporary and dramatic drop in blood flow to the brain, leading to a loss of consciousness. Fainting is one of the body's self defense mechanisms. Passing out puts the brain in a calmed state and causes it to shut down for a while, in order to begin the healing process.    Summary: 1.   Refer to this scale when providing us with your pain level. 2.   Be accurate and careful when reporting your pain level. This will help with your care. 3.   Over-reporting your pain level will lead to loss of credibility. 4.   Even a level of 1/10 means that there is pain and will be treated at our facility. 5.   High, inaccurate reporting will be documented as "Symptom Exaggeration", leading to loss of credibility and suspicions of possible secondary gains such as obtaining more narcotics, or wanting to appear disabled, for fraudulent reasons. 6.   Only pain levels of 5 or below will be seen   at our facility. 7.   Pain levels of 6 and above will be sent to the Emergency Department and the appointment cancelled.  ______________________________________________________________________________________________     

## 2019-08-03 NOTE — Progress Notes (Signed)
Safety precautions to be maintained throughout the outpatient stay will include: orient to surroundings, keep bed in low position, maintain call bell within reach at all times, provide assistance with transfer out of bed and ambulation.  

## 2019-08-03 NOTE — Progress Notes (Signed)
Patient: Jason Faulkner  Service Category: E/M  Provider: Gaspar Cola, MD  DOB: August 16, 1970  DOS: 08/03/2019  Referring Provider: Verita Lamb, NP  MRN: [id number]  Setting: Ambulatory outpatient  PCP: [facility]  Type: New Patient  Specialty: Interventional Pain Management    Location: Office  Delivery: Face-to-face     Primary Reason(s) for Visit: Encounter for initial evaluation of one or more chronic problems (new to examiner) potentially causing chronic pain, and posing a threat to normal musculoskeletal function. (Level of risk: High) CC: Back Pain (lumbar bilateral ) and Neck Pain (cervical bilateral )  HPI  Jason Faulkner is a 49 y.o. year old, male patient, who comes today to see [place] for the first time for an initial evaluation of his chronic pain. He has Alcohol dependence with alcohol-induced mood disorder ([place]); Chronic fatigue; Chronic pain syndrome; Chronic, continuous use of opioids; Depression, major, recurrent, mild ([place]); Encounter for monitoring opioid maintenance therapy; Erythrocytosis; High risk medication use; Low testosterone in male; Other injury of unspecified body region, initial encounter; Pain in arm, unspecified; Panic disorder without agoraphobia; Unspecified mood (affective) disorder ([place]); Chronic low back pain (1ry area of Pain) (Bilateral) (L>R) w/o sciatica; Pharmacologic therapy; Disorder of skeletal system; Problems influencing health status; Cervicalgia; Chronic neck pain (2ry area of Pain) (Posterior) (Bilateral) (L>R); and History of marijuana use on their problem list. Today he comes in for evaluation of his Back Pain (lumbar bilateral ) and Neck Pain (cervical bilateral )  Pain Assessment: Location: Lower, Left, Right(bilateral) Back(neck) Radiating: denies Onset: More than a month ago Duration: Chronic pain Quality: Discomfort, Spasm, Cramping, Constant Severity: 8 /10 (subjective, self-reported pain score)  Note: Reported level is  inconsistent with clinical observations. Clinically the patient looks like a 1/10 A 1/10 is viewed as "Mild" and described as nagging, annoying, but not interfering with basic activities of daily living (ADL). Jason Faulkner is able to eat, bathe, get dressed, do toileting (being able to get on and off the toilet and perform personal hygiene functions), transfer (move in and out of bed or a chair without assistance), and maintain continence (able to control bladder and bowel functions). Physiologic parameters such as blood pressure and heart rate apear wnl. Discrepancy may suggest symptom exaggeration. When using our objective Pain Scale, levels between 6 and 10/10 are said to belong in an emergency room, as it progressively worsens from a 6/10, described as severely limiting, requiring emergency care not usually available at an outpatient pain management facility. At a 6/10 level, communication becomes difficult and requires great effort. Assistance to reach the emergency department may be required. Facial flushing and profuse sweating along with potentially dangerous increases in heart rate and blood pressure will be evident. Effect on ADL: when activity is increased the pain is more. Timing: Constant Modifying factors: smoking marijuana helps the pain.  tylenol/advil mix sometimes helps. BP: (!) 161/96  HR: 87  Onset and Duration: Present less than 3 months Cause of pain: Unknown Severity: Getting worse, NAS-11 at its worse: 10/10, NAS-11 at its best: 10/10, NAS-11 now: 10/10 and NAS-11 on the average: 10/10 Timing: During activity or exercise and After activity or exercise Aggravating Factors: Bending and Prolonged standing Alleviating Factors: Lying down and Medications Associated Problems: Pain that wakes patient up and Pain that does not allow patient to sleep Quality of Pain: Annoying, Disabling, Distressing, Sharp and Uncomfortable Previous Examinations or Tests: The patient denies  none Previous Treatments: The patient denies none  The patient comes  into the clinics today for the first time for a chronic pain management evaluation.  According to the patient the primary area of pain is that of the lower back, bilaterally, with the pain started in the midline and the pain being worse on the left side compared to the right.  He denies any lumbar surgeries, physical therapy, nerve blocks, and he also denies any lower extremity pain.  He indicates that he has an MRI of the lumbar spine pending to be done on June 10.  The patient's secondary area pain is that of the neck, posterior, with the pain starting in the midline and being bilateral.  He also indicates that the left is worse than the right.  He denies any surgeries in the cervical region, physical therapy for the pain, or having had any nerve blocks.  He also indicates pending an MRI of that area.  He denies any shoulder or upper extremity pain.  He indicates pending an MRI of that area as well.  Provocative testing today showed the patient to be positive for reproduction of his pain on hyperextension and rotation of the lumbar spine.  The rest of the exam was negative.  Even though he admits to not having had any nerve blocks or injection therapies for these pain, he also indicates that he is afraid of them and he indicated having had some "injections in the past" which did not help him.  The patient also admitted to the regular use of marijuana to treat this pain.  Today I took the time to provide the patient with information regarding my pain practice. The patient was informed that my practice is divided into two sections: an interventional pain management section, as well as a completely separate and distinct medication management section. I explained that I have procedure days for my interventional therapies, and evaluation days for follow-ups and medication management. Because of the amount of documentation required during  both, they are kept separated. This means that there is the possibility that he may be scheduled for a procedure on one day, and medication management the next. I have also informed him that because of staffing and facility limitations, I no longer take patients for medication management only. To illustrate the reasons for this, I gave the patient the example of surgeons, and how inappropriate it would be to refer a patient to his/her care, just to write for the post-surgical antibiotics on a surgery done by a different surgeon.   Because interventional pain management is my board-certified specialty, the patient was informed that joining my practice means that they are open to any and all interventional therapies. I made it clear that this does not mean that they will be forced to have any procedures done. What this means is that I believe interventional therapies to be essential part of the diagnosis and proper management of chronic pain conditions. Therefore, patients not interested in these interventional alternatives will be better served under the care of a different practitioner.  The patient was also made aware of my Comprehensive Pain Management Safety Guidelines where by joining my practice, they limit all of their nerve blocks and joint injections to those done by our practice, for as long as we are retained to manage their care.   Historic Controlled Substance Pharmacotherapy Review  PMP and historical list of controlled substances: Oxycodone/APAP 5/325; testosterone; alprazolam 0.5 mg; AndroGel; guaifenesin/codeine 100/10/5 mL. Highest opioid analgesic regimen found: Oxycodone/APAP 5/325 1 tablet p.o. 4 times daily (20 mg/day of  oxycodone) (30 MME) Most recent opioid analgesic: Oxycodone/APAP 5/325 1 tablet p.o. 4 times daily (20 mg/day of oxycodone) (30 MME)  Current opioid analgesics: Oxycodone/APAP 5/325 1 tablet p.o. 4 times daily (20 mg/day of oxycodone) (30 MME)  Highest recorded  MME/day: 30 mg/day MME/day: 30 mg/day  Medications: The patient did not bring the medication(s) to the appointment, as requested in our "New Patient Package" Pharmacodynamics: Desired effects: Analgesia: The patient reports >50% benefit. Reported improvement in function: The patient reports medication allows him to accomplish basic ADLs. Clinically meaningful improvement in function (CMIF): Sustained CMIF goals met Perceived effectiveness: Described as relatively effective, allowing for increase in activities of daily living (ADL) Undesirable effects: Side-effects or Adverse reactions: None reported Historical Monitoring: The patient  reports no history of drug use. List of all UDS Test(s): Lab Results  Component Value Date   COCAINSCRNUR NONE DETECTED 12/04/2018   THCU NONE DETECTED 12/04/2018   ETH 205 (H) 12/04/2018   List of other Serum/Urine Drug Screening Test(s):  Lab Results  Component Value Date   COCAINSCRNUR NONE DETECTED 12/04/2018   THCU NONE DETECTED 12/04/2018   ETH 205 (H) 12/04/2018   Historical Background Evaluation: [place] PMP: PDMP reviewed during this encounter. Six (6) year initial data search conducted.             PMP NARX Score Report:  Narcotic: 240 Sedative: 130 Stimulant: 000 [place] Department of public safety, offender search: Editor, commissioning Information) Non-contributory Risk Assessment Profile: Aberrant behavior: None observed or detected today Risk factors for fatal opioid overdose: None identified today PMP NARX Overdose Risk Score: 230 Fatal overdose hazard ratio (HR): Calculation deferred Non-fatal overdose hazard ratio (HR): Calculation deferred Risk of opioid abuse or dependence: 0.7-3.0% with doses ? 36 MME/day and 6.1-26% with doses ? 120 MME/day. Substance use disorder (SUD) risk level: See below Personal History of Substance Abuse (SUD-Substance use disorder):  Alcohol: Positive Male or Male(sober x 1 year)  Illegal Drugs: Positive Male or Male   Rx Drugs: Negative  ORT Risk Level calculation: High Risk Opioid Risk Tool - 08/03/19 1359      Family History of Substance Abuse   Alcohol  Negative    Illegal Drugs  Negative    Rx Drugs  Negative      Personal History of Substance Abuse   Alcohol  Positive Male or Male   sober x 1 year   Illegal Drugs  Positive Male or Male    Rx Drugs  Negative      Age   Age between 64-45 years   No      Psychological Disease   Psychological Disease  Positive    Depression  Positive      Total Score   Opioid Risk Tool Scoring  10    Opioid Risk Interpretation  High Risk      ORT Scoring interpretation table:  Score <3 = Low Risk for SUD  Score between 4-7 = Moderate Risk for SUD  Score >8 = High Risk for Opioid Abuse   PHQ-2 Depression Scale:  Total score:    PHQ-2 Scoring interpretation table: (Score and probability of major depressive disorder)  Score 0 = No depression  Score 1 = 15.4% Probability  Score 2 = 21.1% Probability  Score 3 = 38.4% Probability  Score 4 = 45.5% Probability  Score 5 = 56.4% Probability  Score 6 = 78.6% Probability   PHQ-9 Depression Scale:  Total score:    PHQ-9 Scoring interpretation table:  Score 0-4 = No depression  Score 5-9 = Mild depression  Score 10-14 = Moderate depression  Score 15-19 = Moderately severe depression  Score 20-27 = Severe depression (2.4 times higher risk of SUD and 2.89 times higher risk of overuse)   Pharmacologic Plan: Non-opioid analgesic therapy offered.            Initial impression: Poor candidate for opioid analgesics.  Meds   Current Outpatient Medications:  .  Testosterone 20.25 MG/ACT (1.62%) GEL, Place 20.25 mg onto the skin daily., Disp: , Rfl:   Imaging Review   Complexity Note: No results found under the [place] record.                        ROS  Cardiovascular: No reported cardiovascular signs or symptoms such as High blood pressure, coronary artery disease,  abnormal heart rate or rhythm, heart attack, blood thinner therapy or heart weakness and/or failure Pulmonary or Respiratory: Wheezing and difficulty taking a deep full breath (Asthma) and Smoking Neurological: Stroke (Residual deficits or weakness: no) Psychological-Psychiatric: No reported psychological or psychiatric signs or symptoms such as difficulty sleeping, anxiety, depression, delusions or hallucinations (schizophrenial), mood swings (bipolar disorders) or suicidal ideations or attempts Gastrointestinal: No reported gastrointestinal signs or symptoms such as vomiting or evacuating blood, reflux, heartburn, alternating episodes of diarrhea and constipation, inflamed or scarred liver, or pancreas or irrregular and/or infrequent bowel movements Genitourinary: No reported renal or genitourinary signs or symptoms such as difficulty voiding or producing urine, peeing blood, non-functioning kidney, kidney stones, difficulty emptying the bladder, difficulty controlling the flow of urine, or chronic kidney disease Hematological: No reported hematological signs or symptoms such as prolonged bleeding, low or poor functioning platelets, bruising or bleeding easily, hereditary bleeding problems, low energy levels due to low hemoglobin or being anemic Endocrine: 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 Tengan has No Known Allergies.  Laboratory Chemistry Profile   Renal Lab Results  Component Value Date   BUN 8 12/04/2018   CREATININE 0.80 12/04/2018   GFRAA >60 12/04/2018   GFRNONAA >60 12/04/2018   PROTEINUR NEGATIVE 10/05/2016      Electrolytes Lab Results  Component Value Date   NA 138 12/04/2018   K 3.8 12/04/2018   CL 106 12/04/2018   CALCIUM 8.7 (L) 12/04/2018     Hepatic Lab Results  Component Value Date   AST 32 12/04/2018   ALT 29 12/04/2018   ALBUMIN 4.5 12/04/2018   ALKPHOS 77 12/04/2018     ID Lab Results  Component Value Date   SARSCOV2NAA NEGATIVE 12/05/2018     Bone No results found for: [place], [id number], [id number], [id number], 25OHVITD1, 25OHVITD2, 25OHVITD3, TESTOFREE, TESTOSTERONE   Endocrine Lab Results  Component Value Date   GLUCOSE 89 12/04/2018   GLUCOSEU NEGATIVE 10/05/2016     Neuropathy No results found for: VITAMINB12, FOLATE, HGBA1C, HIV   CNS No results found for: COLORCSF, APPEARCSF, RBCCOUNTCSF, WBCCSF, POLYSCSF, LYMPHSCSF, EOSCSF, PROTEINCSF, GLUCCSF, JCVIRUS, CSFOLI, IGGCSF, LABACHR, ACETBL, LABACHR, ACETBL   Inflammation (CRP: Acute  ESR: Chronic) No results found for: CRP, ESRSEDRATE, LATICACIDVEN   Rheumatology No results found for: RF, ANA, LABURIC, [place], [place], LYMEABIGMQN, HLAB27  Coagulation Lab Results  Component Value Date   PLT 276 12/04/2018     Cardiovascular Lab Results  Component Value Date   TROPONINI <0.03 05/24/2018   HGB 19.1 (H) 12/04/2018   HCT 55.8 (H) 12/04/2018     Screening Lab Results  Component Value Date   SARSCOV2NAA NEGATIVE 12/05/2018     Cancer No results found for: CEA, CA125, LABCA2   Allergens No results found for: ALMOND, APPLE, ASPARAGUS, AVOCADO, BANANA, BARLEY, BASIL, B[place]LE[place], GREENBEAN, LIMABEAN, WHITEBEAN, BEEFIGE, REDBEET, BLUEBERRY, BROCCOLI, CABBAGE, MELON, CARROT, CASEIN, CASHEWNUT, CAULIFLOWER, CELERY     Note: Lab results reviewed.   [place]  Drug: Mr. Coate  reports no history of drug use. Alcohol:  reports current alcohol use. Tobacco:  reports that he has been smoking cigarettes. He has been smoking about 2.00 packs per day. He has never used smokeless tobacco. Medical:  has  a past medical history of Alcohol abuse, COPD (chronic obstructive pulmonary disease) ([place]), Hypertension, and Suicidal ideation. Family: family history includes Stroke in his father.  Past Surgical History:  Procedure Laterality Date  . arm surgery     Active Ambulatory Problems    Diagnosis Date Noted  . Alcohol dependence with alcohol-induced mood disorder ([place])   . Chronic fatigue 11/21/2015  . Chronic pain syndrome 04/23/2015  . Chronic, continuous use of opioids 11/21/2015  . Depression, major, recurrent, mild ([place]) 11/21/2015  . Encounter for monitoring opioid maintenance therapy 11/21/2015  . Erythrocytosis 10/18/2018  . High risk medication use 01/21/2016  . Low testosterone in male 09/30/2017  . Other injury of unspecified body region, initial encounter 05/12/2012  . Pain in arm, unspecified 05/12/2012  . Panic disorder without agoraphobia 09/20/2015  . Unspecified mood (affective) disorder ([place]) 05/12/2012  . Chronic low back pain (1ry area of Pain) (Bilateral) (L>R) w/o sciatica 08/03/2019  . Pharmacologic therapy 08/03/2019  . Disorder of skeletal system 08/03/2019  . Problems influencing health status 08/03/2019  . Cervicalgia 08/03/2019  . Chronic neck pain (2ry area of Pain) (Posterior) (Bilateral) (L>R) 08/03/2019  . History of marijuana use 08/03/2019   Resolved Ambulatory Problems    Diagnosis Date Noted  . No Resolved Ambulatory Problems   Past Medical History:  Diagnosis Date  . Alcohol abuse   . COPD (chronic obstructive pulmonary disease) ([place])   . Hypertension   . Suicidal ideation    Constitutional Exam  General appearance: Well nourished, well developed, and well hydrated. In no apparent acute distress Vitals:   08/03/19 1352  BP: (!) 161/96  Pulse: 87  Resp: 16  Temp: 99 F (37.2 C)  TempSrc: Temporal  SpO2: 100%  Weight: 136 lb (61.7 kg)  Height: '5\' 5"'  (1.651 m)   BMI Assessment: Estimated body mass index is 22.63 kg/m as calculated  from the following:   Height as of this encounter: '5\' 5"'  (1.651 m).   Weight as of this encounter: 136 lb (61.7 kg).  BMI interpretation table: BMI level Category Range association with higher incidence of chronic pain  <18 kg/m2 Underweight   18.5-24.9 kg/m2 Ideal body weight   25-29.9 kg/m2 Overweight Increased incidence by 20%  30-34.9 kg/m2 Obese (Class I) Increased incidence by 68%  35-39.9 kg/m2 Severe obesity (Class II) Increased incidence by 136%  >40 kg/m2 Extreme obesity (Class III) Increased incidence by 254%   Patient's current BMI Ideal Body weight  Body mass index is 22.63 kg/m. Ideal body weight: 61.5 kg (135 lb 9.3 oz) Adjusted ideal body weight: 61.6 kg (  135 lb 12 oz)   BMI Readings from Last 4 Encounters:  08/03/19 22.63 kg/m  12/04/18 22.14 kg/m  06/15/18 20.80 kg/m  05/24/18 20.80 kg/m   Wt Readings from Last 4 Encounters:  08/03/19 136 lb (61.7 kg)  12/04/18 125 lb (56.7 kg)  06/15/18 125 lb (56.7 kg)  05/24/18 125 lb (56.7 kg)    Psych/Mental status: Alert, oriented x 3 (person, place, & time)       Eyes: PERLA Respiratory: No evidence of acute respiratory distress  Cervical Spine Exam  Skin & Axial Inspection: No masses, redness, edema, swelling, or associated skin lesions Alignment: Symmetrical Functional ROM: Unrestricted ROM      Stability: No instability detected Muscle Tone/Strength: Functionally intact. No obvious neuro-muscular anomalies detected. Sensory (Neurological): Movement-associated discomfort Palpation: No palpable anomalies              Upper Extremity (UE) Exam    Side: Right upper extremity  Side: Left upper extremity  Skin & Extremity Inspection: Skin color, temperature, and hair growth are WNL. No peripheral edema or cyanosis. No masses, redness, swelling, asymmetry, or associated skin lesions. No contractures.  Skin & Extremity Inspection: Skin color, temperature, and hair growth are WNL. No peripheral edema or cyanosis.  No masses, redness, swelling, asymmetry, or associated skin lesions. No contractures.  Functional ROM: Unrestricted ROM          Functional ROM: Unrestricted ROM          Muscle Tone/Strength: Functionally intact. No obvious neuro-muscular anomalies detected.  Muscle Tone/Strength: Functionally intact. No obvious neuro-muscular anomalies detected.  Sensory (Neurological): Unimpaired          Sensory (Neurological): Unimpaired          Palpation: No palpable anomalies              Palpation: No palpable anomalies              Provocative Test(s):  Phalen's test: deferred Tinel's test: deferred Apley's scratch test (touch opposite shoulder):  Action 1 (Across chest): deferred Action 2 (Overhead): deferred Action 3 (LB reach): deferred   Provocative Test(s):  Phalen's test: deferred Tinel's test: deferred Apley's scratch test (touch opposite shoulder):  Action 1 (Across chest): deferred Action 2 (Overhead): deferred Action 3 (LB reach): deferred    Thoracic Spine Area Exam  Skin & Axial Inspection: No masses, redness, or swelling Alignment: Symmetrical Functional ROM: Unrestricted ROM Stability: No instability detected Muscle Tone/Strength: Functionally intact. No obvious neuro-muscular anomalies detected. Sensory (Neurological): Unimpaired Muscle strength & Tone: No palpable anomalies  Lumbar Exam  Skin & Axial Inspection: No masses, redness, or swelling Alignment: Symmetrical Functional ROM: Guarding       Stability: No instability detected Muscle Tone/Strength: Functionally intact. No obvious neuro-muscular anomalies detected. Sensory (Neurological): Unimpaired Palpation: No palpable anomalies       Provocative Tests: Hyperextension/rotation test: (+) bilaterally for facet joint pain. Lumbar quadrant test (Kemp's test): deferred today       Lateral bending test: deferred today       Patrick's Maneuver: (-) for bilateral S-I arthralgia and for bilateral hip arthralgia FABER*  test: (-)                   S-I anterior distraction/compression test: deferred today         S-I lateral compression test: deferred today         S-I Thigh-thrust test: deferred today         S-I Gaenslen's  test: deferred today         *(Flexion, ABduction and External Rotation)  Gait & Posture Assessment  Ambulation: Unassisted Gait: Relatively normal for age and body habitus Posture: WNL   Lower Extremity Exam    Side: Right lower extremity  Side: Left lower extremity  Stability: No instability observed          Stability: No instability observed          Skin & Extremity Inspection: Skin color, temperature, and hair growth are WNL. No peripheral edema or cyanosis. No masses, redness, swelling, asymmetry, or associated skin lesions. No contractures.  Skin & Extremity Inspection: Skin color, temperature, and hair growth are WNL. No peripheral edema or cyanosis. No masses, redness, swelling, asymmetry, or associated skin lesions. No contractures.  Functional ROM: Unrestricted ROM         SLR WNL  Functional ROM: Unrestricted ROM         SLR WNL  Muscle Tone/Strength: Able to Toe-walk & Heel-walk without problems  Muscle Tone/Strength: Able to Toe-walk & Heel-walk without problems  Sensory (Neurological): Unimpaired        Sensory (Neurological): Unimpaired        DTR: Patellar: deferred today Achilles: deferred today Plantar: deferred today  DTR: Patellar: deferred today Achilles: deferred today Plantar: deferred today  Palpation: No palpable anomalies  Palpation: No palpable anomalies   Assessment  Primary Diagnosis & Pertinent Problem List: The primary encounter diagnosis was Chronic low back pain (1ry area of Pain) (Bilateral) (L>R) w/o sciatica. Diagnoses of Chronic neck pain (2ry area of Pain) (Posterior) (Bilateral) (L>R), Cervicalgia, Chronic pain syndrome, Pharmacologic therapy, Disorder of skeletal system, Problems influencing health status, Chronic, continuous use of  opioids, Alcohol dependence with alcohol-induced mood disorder ([place]), High risk medication use, and History of marijuana use were also pertinent to this visit.  Visit Diagnosis (New problems to examiner): 1. Chronic low back pain (1ry area of Pain) (Bilateral) (L>R) w/o sciatica   2. Chronic neck pain (2ry area of Pain) (Posterior) (Bilateral) (L>R)   3. Cervicalgia   4. Chronic pain syndrome   5. Pharmacologic therapy   6. Disorder of skeletal system   7. Problems influencing health status   8. Chronic, continuous use of opioids   9. Alcohol dependence with alcohol-induced mood disorder ([place])   10. High risk medication use   11. History of marijuana use    Plan of Care (Initial workup plan)  Note: Mr. Cahall was reminded that as per protocol, today's visit has been an evaluation only. We have not taken over the patient's controlled substance management.  In fact, based on the data collected, he is considered to be high risk for substance use disorder.  By his own accord, he also uses cannabinoids on a regular basis.  The prescription of controlled substances along with illicit substances is strictly prohibited in our practice.  Therefore we will not be taking over his medication management.  However, we have informed the patient that our specialty is interventional pain management and based on our evaluation, he is likely to be having problems with his lumbar facets.  The problem may be in its early stages and therefore x-rays may not demonstrate significant pathology.  Therefore, I have suggested the possibility of diagnostic bilateral lumbar facet blocks.  If he was to get good relief of the pain with this, he may be a good candidate for radiofrequency ablation.  In addition, we had identified that the patient has not  had any physical therapy for the neck or low back pain and therefore today we have sent a referral for these.  In addition to this, the objective data available at this time does  not justify the long-term use of opioid analgesics.  Problem-specific plan: No problem-specific Assessment & Plan notes found for this encounter.   Lab Orders     Compliance Drug Analysis, Ur     Comp. Metabolic Panel (12)     Magnesium     Vitamin B12     Sedimentation rate     25-Hydroxy vitamin D Lcms D2+D3     C-reactive protein  Imaging Orders     DG Lumbar Spine Complete W/Bend     DG Cervical Spine With Flex & Extend  Referral Orders     Ambulatory referral to Physical Therapy Procedure Orders    No procedure(s) ordered today   Pharmacotherapy (current): Medications ordered:  No orders of the defined types were placed in this encounter.  Medications administered during this visit: [name] had no medications administered during this visit.   Pharmacological management options:  Opioid Analgesics: The patient was informed that there is no guarantee that he would be a candidate for opioid analgesics. The decision will be made following CDC guidelines. This decision will be based on the results of diagnostic studies, as well as Mr. Genest risk profile.  My initial impression, based on his profile is that he is at very high risk for SUD.  He also indicated the frequent use of illicit substances such as cannabinoids.  For this reason, he would not be an adequate candidate for any type of opioid analgesics in our practice.  Membrane stabilizer: To be determined at a later time  Muscle relaxant: To be determined at a later time  NSAID: To be determined at a later time  Other analgesic(s): To be determined at a later time   Interventional management options: Mr. Deeb was informed that there is no guarantee that he would be a candidate for interventional therapies. The decision will be based on the results of diagnostic studies, as well as Mr. Cafarelli risk profile.  Procedure(s) under consideration:  Diagnostic bilateral lumbar facet block #1     Provider-requested follow-up: Return for (VV), (2V), (s/p Tests).  Future Appointments  Date Time Provider [place]  08/21/2019  9:45 AM Milinda Pointer, MD [facility] None    Note by: Gaspar Cola, MD Date: 08/03/2019; Time: 4:39 PM

## 2019-08-07 LAB — COMPLIANCE DRUG ANALYSIS, UR

## 2019-08-21 ENCOUNTER — Telehealth: Payer: Medicaid Other | Admitting: Pain Medicine

## 2020-07-20 ENCOUNTER — Emergency Department: Payer: Medicaid Other

## 2020-07-20 ENCOUNTER — Other Ambulatory Visit: Payer: Self-pay

## 2020-07-20 ENCOUNTER — Emergency Department
Admission: EM | Admit: 2020-07-20 | Discharge: 2020-07-20 | Disposition: A | Discharge status: Home / Self Care | Payer: Medicaid Other | Attending: Emergency Medicine | Admitting: Emergency Medicine

## 2020-07-20 DIAGNOSIS — R079 Chest pain, unspecified: Secondary | ICD-10-CM | POA: Diagnosis present

## 2020-07-20 DIAGNOSIS — I10 Essential (primary) hypertension: Secondary | ICD-10-CM | POA: Diagnosis not present

## 2020-07-20 DIAGNOSIS — F17210 Nicotine dependence, cigarettes, uncomplicated: Secondary | ICD-10-CM | POA: Diagnosis not present

## 2020-07-20 DIAGNOSIS — R0789 Other chest pain: Secondary | ICD-10-CM | POA: Insufficient documentation

## 2020-07-20 DIAGNOSIS — J449 Chronic obstructive pulmonary disease, unspecified: Secondary | ICD-10-CM | POA: Diagnosis not present

## 2020-07-20 LAB — CBC
HCT: 46.9 % (ref 39.0–52.0)
Hemoglobin: 16.3 g/dL (ref 13.0–17.0)
MCH: 34 pg (ref 26.0–34.0)
MCHC: 34.8 g/dL (ref 30.0–36.0)
MCV: 97.9 fL (ref 80.0–100.0)
Platelets: 266 10*3/uL (ref 150–400)
RBC: 4.79 MIL/uL (ref 4.22–5.81)
RDW: 14 % (ref 11.5–15.5)
WBC: 16.5 10*3/uL — ABNORMAL HIGH (ref 4.0–10.5)
nRBC: 0 % (ref 0.0–0.2)

## 2020-07-20 LAB — BASIC METABOLIC PANEL
Anion gap: 7 (ref 5–15)
BUN: 17 mg/dL (ref 6–20)
CO2: 27 mmol/L (ref 22–32)
Calcium: 9.3 mg/dL (ref 8.9–10.3)
Chloride: 100 mmol/L (ref 98–111)
Creatinine, Ser: 1.17 mg/dL (ref 0.61–1.24)
GFR, Estimated: >60 mL/min (ref >60)
Glucose, Bld: 100 mg/dL — ABNORMAL HIGH (ref 70–99)
Potassium: 3.9 mmol/L (ref 3.5–5.1)
Sodium: 134 mmol/L — ABNORMAL LOW (ref 135–145)

## 2020-07-20 LAB — TROPONIN I (HIGH SENSITIVITY): Troponin I (High Sensitivity): 4 ng/L (ref <18)

## 2020-07-20 MED ORDER — IPRATROPIUM-ALBUTEROL 0.5-2.5 (3) MG/3ML IN SOLN
3.0000 mL | Freq: Once | RESPIRATORY_TRACT | Status: AC
  Administered 2020-07-20: 3 mL via RESPIRATORY_TRACT
  Filled 2020-07-20: qty 3

## 2020-07-20 MED ORDER — NITROGLYCERIN 0.4 MG SL SUBL
0.4000 mg | SUBLINGUAL_TABLET | Freq: Once | SUBLINGUAL | Status: AC
  Administered 2020-07-20: 0.4 mg via SUBLINGUAL
  Filled 2020-07-20: qty 1

## 2020-07-20 NOTE — ED Triage Notes (Signed)
Pt c/o chest pain that woke him up out of sleep a couple hours ago. Pt states he hasn't felt pain like this before. Pt has hx of COPD. Pt describes pain in the middle of his chest as sharp and burning.

## 2020-07-20 NOTE — ED Notes (Signed)
Report received on patient.  

## 2020-07-20 NOTE — ED Notes (Signed)
Breathing tx complete, reports pain decreased to 6/10.

## 2020-07-20 NOTE — ED Provider Notes (Signed)
[facility] Emergency Department Provider Note   ____________________________________________    I have reviewed the triage vital signs and the nursing notes.   HISTORY  Chief Complaint Chest Pain     HPI Jason Faulkner is a 50 y.o. male with a history of alcohol abuse, COPD, hypertension, chronic back pain who reports with complaints of chest pain.  Patient reports that 1 AM he woke up with discomfort in his chest described as a sharp and burning sensation.  He thought it might be heartburn so he took Zantac, he also took a benzo because he thought it might be his anxiety.  He became concerned when his blood pressure was found to be elevated by his fiance so presents to the emergency department.  No radiation, no pleurisy, no calf pain  Past Medical History:  Diagnosis Date  . Alcohol abuse   . COPD (chronic obstructive pulmonary disease) (HCC)   . Hypertension   . Suicidal ideation     Patient Active Problem List   Diagnosis Date Noted  . Chronic low back pain (1ry area of Pain) (Bilateral) (L>R) w/o sciatica 08/03/2019  . Pharmacologic therapy 08/03/2019  . Disorder of skeletal system 08/03/2019  . Problems influencing health status 08/03/2019  . Cervicalgia 08/03/2019  . Chronic neck pain (2ry area of Pain) (Posterior) (Bilateral) (L>R) 08/03/2019  . History of marijuana use 08/03/2019  . Alcohol dependence with alcohol-induced mood disorder (HCC)   . Erythrocytosis 10/18/2018  . Low testosterone in male 09/30/2017  . High risk medication use 01/21/2016  . Chronic fatigue 11/21/2015  . Chronic, continuous use of opioids 11/21/2015  . 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 Encounter for monitoring opioid maintenance therapy 11/21/2015  . Panic disorder without agoraphobia 09/20/2015  . Chronic pain syndrome 04/23/2015  . Other injury of unspecified body region, initial encounter 05/12/2012  . Pain in arm, unspecified  05/12/2012  . Unspecified mood (affective) disorder (HCC) 05/12/2012    Past Surgical History:  Procedure Laterality Date  . arm surgery      Prior to Admission medications   Not on File     Allergies Patient has no known allergies.  Family History  Problem Relation Age of Onset  . Stroke Father     Social History Social History   Tobacco Use  . Smoking status: Current Every Day Smoker    Packs/day: 2.00    Types: Cigarettes  . Smokeless tobacco: Never Used  . Tobacco comment: Pt declines  Vaping Use  . Vaping Use: Never used  Substance Use Topics  . Alcohol use: Yes    Comment: varies, drinks daily  . Drug use: No    Review of Systems  Constitutional: No fever/chills Eyes: No visual changes.  ENT: No sore throat. Cardiovascular: As above Respiratory: History of COPD, mild shortness of breath Gastrointestinal: No abdominal pain.   Genitourinary: Negative for dysuria. Musculoskeletal: Negative for back pain. Skin: Negative for rash. Neurological: Negative for headaches   ____________________________________________   PHYSICAL EXAM:  VITAL SIGNS: ED Triage Vitals  Enc Vitals Group     BP 07/20/20 0653 (!) 170/100     Pulse Rate 07/20/20 0650 84     Resp 07/20/20 0653 12     Temp 07/20/20 0656 97.8 F (36.6 C)     Temp Source 07/20/20 0650 Oral     SpO2 07/20/20 0656 98 %     Weight 07/20/20 0657 61.2 kg (135 lb)  Height 07/20/20 0657 1.575 m (5\' 2" )     Head Circumference --      Peak Flow --      Pain Score 07/20/20 0657 10     Pain Loc --      Pain Edu? --      Excl. in GC? --     Constitutional: Alert and oriented.   Nose: No congestion/rhinnorhea. Mouth/Throat: Mucous membranes are moist.   Neck:  Painless ROM Cardiovascular: Normal rate, regular rhythm. Grossly normal heart sounds.  Good peripheral circulation. Respiratory: Normal respiratory effort.  No retractions.  Mild bibasilar wheezing Gastrointestinal: Soft and  nontender. No distention.  No CVA tenderness.  Musculoskeletal: No lower extremity tenderness nor edema.  Warm and well perfused Neurologic:  Normal speech and language. No gross focal neurologic deficits are appreciated.  Skin:  Skin is warm, dry and intact. No rash noted. Psychiatric: Mood and affect are normal. Speech and behavior are normal.  ____________________________________________   LABS (all labs ordered are listed, but only abnormal results are displayed)  Labs Reviewed  BASIC METABOLIC PANEL - Abnormal; Notable for the following components:      Result Value   Sodium 134 (*)    Glucose, Bld 100 (*)    All other components within normal limits  CBC - Abnormal; Notable for the following components:   WBC 16.5 (*)    All other components within normal limits  COMPREHENSIVE METABOLIC PANEL  TROPONIN I (HIGH SENSITIVITY)   ____________________________________________  EKG  ED ECG REPORT I, 07/22/20, the attending physician, personally viewed and interpreted this ECG.  Date: 07/20/2020  Rhythm: normal sinus rhythm QRS Axis: normal Intervals: normal ST/T Wave abnormalities: normal Narrative Interpretation: Baseline wander but overall reassuring EKG  ____________________________________________  RADIOLOGY  Chest x-ray viewed by me, no acute abnormality ____________________________________________   PROCEDURES  Procedure(s) performed: yes  .1-3 Lead EKG Interpretation Performed by: 07/22/2020, MD Authorized by: Jene Every, MD     Interpretation: normal     ECG rate assessment: normal     Rhythm: sinus rhythm     Ectopy: none     Conduction: normal       Critical Care performed: No ____________________________________________   INITIAL IMPRESSION / ASSESSMENT AND PLAN / ED COURSE  Pertinent labs & imaging results that were available during my care of the patient were reviewed by me and considered in my medical decision making (see  chart for details).  Patient presents with chest pain as described above, overall patient is well-appearing with reassuring EKG.  Differential includes GERD/gastritis, anxiety, ACS, bronchospasm  Will give nitroglycerin, DuoNeb given bibasilar wheezing.  EKG reassuring, pending high sensitive troponin.  X-ray pending as well.  Chest x-ray is reassuring, high sensitive troponin is normal, onset of pain greater than 3 hours prior to arrival  Patient's symptoms have resolved, he is well-appearing appropriate for discharge with outpatient cardiology follow-up, strict return precautions discussed    ____________________________________________   FINAL CLINICAL IMPRESSION(S) / ED DIAGNOSES  Final diagnoses:  Atypical chest pain        Note:  This document was prepared using Dragon voice recognition software and may include unintentional dictation errors.   Jene Every, MD 07/20/20 1004

## 2020-07-20 NOTE — ED Notes (Signed)
Reports chest pain 10/10.  States pain feels better when he puts pressure on chest with his fist.  Nitro SL given as ordered.

## 2020-08-26 ENCOUNTER — Telehealth: Payer: Self-pay

## 2020-08-26 NOTE — Telephone Encounter (Signed)
Left message requesting to call back to find out whether he has seen a cardiologist in the past to get records if needed for upcoming appointment with Dr. Mariah Milling on 09/03/20.

## 2020-09-03 ENCOUNTER — Ambulatory Visit: Payer: Medicaid Other | Admitting: Cardiovascular Disease

## 2020-09-04 ENCOUNTER — Encounter: Payer: Self-pay | Admitting: Cardiovascular Disease
